# Patient Record
Sex: Male | Born: 1985 | Race: Black or African American | Hispanic: No | Marital: Single
Health system: Midwestern US, Community
[De-identification: ages and names within clinical notes are randomized; demographics above are authoritative.]

## PROBLEM LIST (undated history)

## (undated) DIAGNOSIS — K5090 Crohn's disease, unspecified, without complications: Secondary | ICD-10-CM

## (undated) DIAGNOSIS — D649 Anemia, unspecified: Secondary | ICD-10-CM

---

## 2013-06-09 ENCOUNTER — Emergency Department: Payer: Self-pay | Admitting: Emergency Medicine

## 2013-06-09 LAB — URINALYSIS, COMPLETE
Bacteria: NONE SEEN
Bilirubin,UR: NEGATIVE
Blood: NEGATIVE
GLUCOSE, UR: NEGATIVE mg/dL (ref 0–75)
Leukocyte Esterase: NEGATIVE
NITRITE: NEGATIVE
PROTEIN: NEGATIVE
Ph: 6 (ref 4.5–8.0)
RBC,UR: 2 /HPF (ref 0–5)
Specific Gravity: 1.023 (ref 1.003–1.030)
Squamous Epithelial: <1
WBC UR: 1 /HPF (ref 0–5)

## 2013-06-09 LAB — COMPREHENSIVE METABOLIC PANEL
ALK PHOS: 57 U/L
ALT: 12 U/L (ref 12–78)
ANION GAP: 9 (ref 7–16)
Albumin: 3.7 g/dL (ref 3.4–5.0)
BILIRUBIN TOTAL: 0.3 mg/dL (ref 0.2–1.0)
BUN: 11 mg/dL (ref 7–18)
CHLORIDE: 107 mmol/L (ref 98–107)
Calcium, Total: 8.6 mg/dL (ref 8.5–10.1)
Co2: 28 mmol/L (ref 21–32)
Creatinine: 0.61 mg/dL (ref 0.60–1.30)
EGFR (Non-African Amer.): >60
Glucose: 75 mg/dL (ref 65–99)
OSMOLALITY: 285 (ref 275–301)
Potassium: 3.4 mmol/L — ABNORMAL LOW (ref 3.5–5.1)
SGOT(AST): 10 U/L — ABNORMAL LOW (ref 15–37)
Sodium: 144 mmol/L (ref 136–145)
Total Protein: 7.2 g/dL (ref 6.4–8.2)

## 2013-06-09 LAB — CBC WITH DIFFERENTIAL/PLATELET
Basophil #: 0 10*3/uL (ref 0.0–0.1)
Basophil %: 0.3 %
EOS PCT: 0.2 %
Eosinophil #: 0 10*3/uL (ref 0.0–0.7)
HCT: 25.9 % — ABNORMAL LOW (ref 40.0–52.0)
HGB: 7.8 g/dL — ABNORMAL LOW (ref 13.0–18.0)
LYMPHS PCT: 17.5 %
Lymphocyte #: 1.1 10*3/uL (ref 1.0–3.6)
MCH: 23.6 pg — ABNORMAL LOW (ref 26.0–34.0)
MCHC: 30.2 g/dL — ABNORMAL LOW (ref 32.0–36.0)
MCV: 78 fL — ABNORMAL LOW (ref 80–100)
MONOS PCT: 11.7 %
Monocyte #: 0.7 x10 3/mm (ref 0.2–1.0)
NEUTROS PCT: 70.3 %
Neutrophil #: 4.3 10*3/uL (ref 1.4–6.5)
PLATELETS: 399 10*3/uL (ref 150–440)
RBC: 3.31 10*6/uL — AB (ref 4.40–5.90)
RDW: 18.6 % — AB (ref 11.5–14.5)
WBC: 6.1 10*3/uL (ref 3.8–10.6)

## 2013-06-17 ENCOUNTER — Inpatient Hospital Stay: Payer: Self-pay | Admitting: Internal Medicine

## 2013-06-17 LAB — CBC
HCT: 25.3 % — ABNORMAL LOW (ref 40.0–52.0)
HGB: 7.6 g/dL — AB (ref 13.0–18.0)
MCH: 23.4 pg — ABNORMAL LOW (ref 26.0–34.0)
MCHC: 30.1 g/dL — AB (ref 32.0–36.0)
MCV: 78 fL — AB (ref 80–100)
Platelet: 237 10*3/uL (ref 150–440)
RBC: 3.25 10*6/uL — ABNORMAL LOW (ref 4.40–5.90)
RDW: 18.4 % — ABNORMAL HIGH (ref 11.5–14.5)
WBC: 4.6 10*3/uL (ref 3.8–10.6)

## 2013-06-17 LAB — COMPREHENSIVE METABOLIC PANEL
ALT: 11 U/L — AB (ref 12–78)
Albumin: 3.6 g/dL (ref 3.4–5.0)
Alkaline Phosphatase: 59 U/L
Anion Gap: 4 — ABNORMAL LOW (ref 7–16)
BILIRUBIN TOTAL: 0.4 mg/dL (ref 0.2–1.0)
BUN: 4 mg/dL — AB (ref 7–18)
CO2: 32 mmol/L (ref 21–32)
CREATININE: 0.73 mg/dL (ref 0.60–1.30)
Calcium, Total: 8.8 mg/dL (ref 8.5–10.1)
Chloride: 106 mmol/L (ref 98–107)
EGFR (African American): >60
GLUCOSE: 81 mg/dL (ref 65–99)
OSMOLALITY: 279 (ref 275–301)
Potassium: 3.2 mmol/L — ABNORMAL LOW (ref 3.5–5.1)
SGOT(AST): 8 U/L — ABNORMAL LOW (ref 15–37)
SODIUM: 142 mmol/L (ref 136–145)
Total Protein: 6.8 g/dL (ref 6.4–8.2)

## 2013-06-17 LAB — LIPASE, BLOOD: LIPASE: 261 U/L (ref 73–393)

## 2013-06-17 LAB — MAGNESIUM: Magnesium: 1.9 mg/dL

## 2013-06-17 LAB — URINALYSIS, COMPLETE
BACTERIA: NONE SEEN
Bilirubin,UR: NEGATIVE
Blood: NEGATIVE
GLUCOSE, UR: NEGATIVE mg/dL (ref 0–75)
Ketone: NEGATIVE
LEUKOCYTE ESTERASE: NEGATIVE
Nitrite: NEGATIVE
PROTEIN: NEGATIVE
Ph: 8 (ref 4.5–8.0)
SQUAMOUS EPITHELIAL: NONE SEEN
Specific Gravity: 1.004 (ref 1.003–1.030)
WBC UR: NONE SEEN /HPF (ref 0–5)

## 2013-06-17 LAB — CLOSTRIDIUM DIFFICILE(ARMC)

## 2013-06-18 LAB — CBC WITH DIFFERENTIAL/PLATELET
BASOS ABS: 0 10*3/uL (ref 0.0–0.1)
Basophil %: 0.7 %
Eosinophil #: 0.1 10*3/uL (ref 0.0–0.7)
Eosinophil %: 2.8 %
HCT: 25.8 % — ABNORMAL LOW (ref 40.0–52.0)
HGB: 7.7 g/dL — ABNORMAL LOW (ref 13.0–18.0)
LYMPHS PCT: 27.4 %
Lymphocyte #: 1.3 10*3/uL (ref 1.0–3.6)
MCH: 22.9 pg — ABNORMAL LOW (ref 26.0–34.0)
MCHC: 29.8 g/dL — AB (ref 32.0–36.0)
MCV: 77 fL — AB (ref 80–100)
Monocyte #: 0.5 x10 3/mm (ref 0.2–1.0)
Monocyte %: 10.3 %
Neutrophil #: 2.8 10*3/uL (ref 1.4–6.5)
Neutrophil %: 58.8 %
Platelet: 226 10*3/uL (ref 150–440)
RBC: 3.36 10*6/uL — ABNORMAL LOW (ref 4.40–5.90)
RDW: 18.1 % — ABNORMAL HIGH (ref 11.5–14.5)
WBC: 4.7 10*3/uL (ref 3.8–10.6)

## 2013-06-18 LAB — BASIC METABOLIC PANEL
Anion Gap: 2 — ABNORMAL LOW (ref 7–16)
BUN: 2 mg/dL — AB (ref 7–18)
CHLORIDE: 111 mmol/L — AB (ref 98–107)
CREATININE: 0.76 mg/dL (ref 0.60–1.30)
Calcium, Total: 8.6 mg/dL (ref 8.5–10.1)
Co2: 26 mmol/L (ref 21–32)
EGFR (Non-African Amer.): >60
Glucose: 69 mg/dL (ref 65–99)
OSMOLALITY: 272 (ref 275–301)
POTASSIUM: 3.8 mmol/L (ref 3.5–5.1)
SODIUM: 139 mmol/L (ref 136–145)

## 2013-06-18 LAB — MAGNESIUM: Magnesium: 1.9 mg/dL

## 2013-06-23 ENCOUNTER — Observation Stay: Payer: Self-pay | Admitting: Internal Medicine

## 2013-06-23 LAB — URINALYSIS, COMPLETE
Bilirubin,UR: NEGATIVE
Blood: NEGATIVE
GLUCOSE, UR: NEGATIVE mg/dL (ref 0–75)
Leukocyte Esterase: NEGATIVE
Nitrite: NEGATIVE
Ph: 6 (ref 4.5–8.0)
Protein: NEGATIVE
RBC,UR: 8 /HPF (ref 0–5)
Specific Gravity: 1.021 (ref 1.003–1.030)
Squamous Epithelial: <1
WBC UR: 2 /HPF (ref 0–5)

## 2013-06-23 LAB — CBC
HCT: 26.5 % — ABNORMAL LOW (ref 40.0–52.0)
HGB: 7.9 g/dL — AB (ref 13.0–18.0)
MCH: 23.7 pg — ABNORMAL LOW (ref 26.0–34.0)
MCHC: 30 g/dL — AB (ref 32.0–36.0)
MCV: 79 fL — ABNORMAL LOW (ref 80–100)
Platelet: 244 10*3/uL (ref 150–440)
RBC: 3.35 10*6/uL — ABNORMAL LOW (ref 4.40–5.90)
RDW: 18.3 % — AB (ref 11.5–14.5)
WBC: 6.3 10*3/uL (ref 3.8–10.6)

## 2013-06-23 LAB — BASIC METABOLIC PANEL
ANION GAP: 7 (ref 7–16)
BUN: 10 mg/dL (ref 7–18)
CALCIUM: 9.1 mg/dL (ref 8.5–10.1)
Chloride: 109 mmol/L — ABNORMAL HIGH (ref 98–107)
Co2: 26 mmol/L (ref 21–32)
Creatinine: 0.73 mg/dL (ref 0.60–1.30)
EGFR (Non-African Amer.): >60
GLUCOSE: 80 mg/dL (ref 65–99)
Osmolality: 281 (ref 275–301)
Potassium: 3.4 mmol/L — ABNORMAL LOW (ref 3.5–5.1)
SODIUM: 142 mmol/L (ref 136–145)

## 2013-06-24 ENCOUNTER — Observation Stay: Payer: Self-pay | Admitting: Specialist

## 2013-06-24 LAB — COMPREHENSIVE METABOLIC PANEL
ALK PHOS: 59 U/L
AST: 12 U/L — AB (ref 15–37)
Albumin: 3.5 g/dL (ref 3.4–5.0)
Anion Gap: 7 (ref 7–16)
BILIRUBIN TOTAL: 0.1 mg/dL — AB (ref 0.2–1.0)
BUN: 6 mg/dL — AB (ref 7–18)
CALCIUM: 8.4 mg/dL — AB (ref 8.5–10.1)
Chloride: 111 mmol/L — ABNORMAL HIGH (ref 98–107)
Co2: 26 mmol/L (ref 21–32)
Creatinine: 0.74 mg/dL (ref 0.60–1.30)
EGFR (African American): >60
Glucose: 76 mg/dL (ref 65–99)
OSMOLALITY: 283 (ref 275–301)
Potassium: 3.6 mmol/L (ref 3.5–5.1)
SGPT (ALT): 9 U/L — ABNORMAL LOW (ref 12–78)
Sodium: 144 mmol/L (ref 136–145)
TOTAL PROTEIN: 6.6 g/dL (ref 6.4–8.2)

## 2013-06-24 LAB — CBC
HCT: 24.2 % — ABNORMAL LOW (ref 40.0–52.0)
HGB: 7.2 g/dL — ABNORMAL LOW (ref 13.0–18.0)
MCH: 23.2 pg — ABNORMAL LOW (ref 26.0–34.0)
MCHC: 29.7 g/dL — AB (ref 32.0–36.0)
MCV: 78 fL — AB (ref 80–100)
Platelet: 248 10*3/uL (ref 150–440)
RBC: 3.09 10*6/uL — ABNORMAL LOW (ref 4.40–5.90)
RDW: 18.6 % — AB (ref 11.5–14.5)
WBC: 5.8 10*3/uL (ref 3.8–10.6)

## 2013-06-24 LAB — DRUG SCREEN, URINE
AMPHETAMINES, UR SCREEN: NEGATIVE (ref ?–1000)
BARBITURATES, UR SCREEN: NEGATIVE (ref ?–200)
BENZODIAZEPINE, UR SCRN: NEGATIVE (ref ?–200)
CANNABINOID 50 NG, UR ~~LOC~~: NEGATIVE (ref ?–50)
Cocaine Metabolite,Ur ~~LOC~~: NEGATIVE (ref ?–300)
MDMA (ECSTASY) UR SCREEN: NEGATIVE (ref ?–500)
Methadone, Ur Screen: NEGATIVE (ref ?–300)
OPIATE, UR SCREEN: POSITIVE (ref ?–300)
Phencyclidine (PCP) Ur S: NEGATIVE (ref ?–25)
Tricyclic, Ur Screen: NEGATIVE (ref ?–1000)

## 2013-06-24 LAB — LIPASE, BLOOD: LIPASE: 278 U/L (ref 73–393)

## 2013-06-25 LAB — CBC WITH DIFFERENTIAL/PLATELET
BASOS ABS: 0 10*3/uL (ref 0.0–0.1)
BASOS PCT: 0.6 %
EOS ABS: 0.1 10*3/uL (ref 0.0–0.7)
EOS PCT: 2.3 %
HCT: 23.9 % — ABNORMAL LOW (ref 40.0–52.0)
HGB: 7.1 g/dL — ABNORMAL LOW (ref 13.0–18.0)
LYMPHS PCT: 34.4 %
Lymphocyte #: 1.4 10*3/uL (ref 1.0–3.6)
MCH: 23.1 pg — ABNORMAL LOW (ref 26.0–34.0)
MCHC: 29.8 g/dL — AB (ref 32.0–36.0)
MCV: 78 fL — ABNORMAL LOW (ref 80–100)
Monocyte #: 0.5 x10 3/mm (ref 0.2–1.0)
Monocyte %: 12.5 %
NEUTROS ABS: 2 10*3/uL (ref 1.4–6.5)
Neutrophil %: 50.2 %
Platelet: 228 10*3/uL (ref 150–440)
RBC: 3.07 10*6/uL — AB (ref 4.40–5.90)
RDW: 18.8 % — ABNORMAL HIGH (ref 11.5–14.5)
WBC: 3.9 10*3/uL (ref 3.8–10.6)

## 2014-04-19 ENCOUNTER — Emergency Department
Admission: EM | Admit: 2014-04-19 | Discharge: 2014-04-19 | Disposition: A | Discharge status: Home / Self Care | Payer: Medicaid Other | Attending: Emergency Medicine | Admitting: Emergency Medicine

## 2014-04-19 ENCOUNTER — Emergency Department: Payer: Self-pay

## 2014-04-19 ENCOUNTER — Emergency Department: Payer: Medicaid Other

## 2014-04-19 DIAGNOSIS — G8929 Other chronic pain: Secondary | ICD-10-CM | POA: Insufficient documentation

## 2014-04-19 DIAGNOSIS — R109 Unspecified abdominal pain: Secondary | ICD-10-CM | POA: Insufficient documentation

## 2014-04-19 HISTORY — DX: Crohn's disease, unspecified, without complications: K50.90

## 2014-04-19 HISTORY — DX: Anemia, unspecified: D64.9

## 2014-04-19 LAB — COMPREHENSIVE METABOLIC PANEL
ALT: 10 U/L (ref 0–55)
AST (SGOT): 15 U/L (ref 5–34)
Albumin/Globulin Ratio: 1.4 (ref 0.9–2.2)
Albumin: 4.2 g/dL (ref 3.5–5.0)
Alkaline Phosphatase: 78 U/L (ref 38–106)
BUN: 8 mg/dL — ABNORMAL LOW (ref 9.0–28.0)
Bilirubin, Total: 0.5 mg/dL (ref 0.2–1.2)
CO2: 29 mEq/L (ref 22–29)
Calcium: 9.5 mg/dL (ref 8.5–10.5)
Chloride: 103 mEq/L (ref 100–111)
Creatinine: 0.8 mg/dL (ref 0.7–1.3)
Globulin: 2.9 g/dL (ref 2.0–3.6)
Glucose: 83 mg/dL (ref 70–100)
Potassium: 4.1 mEq/L (ref 3.5–5.1)
Protein, Total: 7.1 g/dL (ref 6.0–8.3)
Sodium: 140 mEq/L (ref 136–145)

## 2014-04-19 LAB — URINALYSIS, REFLEX TO MICROSCOPIC EXAM IF INDICATED
Bilirubin, UA: NEGATIVE
Blood, UA: NEGATIVE
Glucose, UA: NEGATIVE
Ketones UA: NEGATIVE
Leukocyte Esterase, UA: NEGATIVE
Nitrite, UA: NEGATIVE
Protein, UR: NEGATIVE
Specific Gravity UA: 1.01 (ref 1.001–1.035)
Urine pH: 7 (ref 5.0–8.0)
Urobilinogen, UA: NORMAL mg/dL

## 2014-04-19 LAB — CBC AND DIFFERENTIAL
Basophils Absolute Automated: 0.01 10*3/uL (ref 0.00–0.20)
Basophils Automated: 0 %
Eosinophils Absolute Automated: 0.06 10*3/uL (ref 0.00–0.70)
Eosinophils Automated: 1 %
Hematocrit: 34.8 % — ABNORMAL LOW (ref 42.0–52.0)
Hgb: 10.8 g/dL — ABNORMAL LOW (ref 13.0–17.0)
Immature Granulocytes Absolute: 0 10*3/uL
Immature Granulocytes: 0 %
Lymphocytes Absolute Automated: 1.37 10*3/uL (ref 0.50–4.40)
Lymphocytes Automated: 24 %
MCH: 27.6 pg — ABNORMAL LOW (ref 28.0–32.0)
MCHC: 31 g/dL — ABNORMAL LOW (ref 32.0–36.0)
MCV: 88.8 fL (ref 80.0–100.0)
MPV: 10.4 fL (ref 9.4–12.3)
Monocytes Absolute Automated: 0.43 10*3/uL (ref 0.00–1.20)
Monocytes: 8 %
Neutrophils Absolute: 3.77 10*3/uL (ref 1.80–8.10)
Neutrophils: 67 %
Nucleated RBC: 0 /100 WBC (ref 0–1)
Platelets: 286 10*3/uL (ref 140–400)
RBC: 3.92 10*6/uL — ABNORMAL LOW (ref 4.70–6.00)
RDW: 15 % (ref 12–15)
WBC: 5.64 10*3/uL (ref 3.50–10.80)

## 2014-04-19 LAB — GFR: EGFR: >60

## 2014-04-19 LAB — SEDIMENTATION RATE: Sed Rate: 20 mm/Hr — ABNORMAL HIGH (ref 0–15)

## 2014-04-19 LAB — LIPASE: Lipase: 28 U/L (ref 8–78)

## 2014-04-19 LAB — C-REACTIVE PROTEIN: C-Reactive Protein: 0.2 mg/dL (ref 0.0–0.8)

## 2014-04-19 MED ORDER — HYDROMORPHONE HCL 1 MG/ML IJ SOLN
1.0000 mg | Freq: Once | INTRAMUSCULAR | Status: DC
Start: 2014-04-19 — End: 2014-04-19

## 2014-04-19 MED ORDER — HYDROMORPHONE HCL 1 MG/ML IJ SOLN
1.0000 mg | Freq: Once | INTRAMUSCULAR | Status: AC
Start: 2014-04-19 — End: 2014-04-19
  Administered 2014-04-19: 1 mg via INTRAVENOUS

## 2014-04-19 MED ORDER — ONDANSETRON HCL 4 MG/2ML IJ SOLN
4.0000 mg | Freq: Once | INTRAMUSCULAR | Status: AC
Start: 2014-04-19 — End: 2014-04-19
  Administered 2014-04-19: 4 mg via INTRAVENOUS

## 2014-04-19 MED ORDER — ONDANSETRON HCL 4 MG/2ML IJ SOLN
4.0000 mg | Freq: Once | INTRAMUSCULAR | Status: DC
Start: 2014-04-19 — End: 2014-04-19

## 2014-04-19 MED ORDER — ONDANSETRON HCL 4 MG/2ML IJ SOLN
4.0000 mg | Freq: Once | INTRAMUSCULAR | Status: DC
Start: 2014-04-19 — End: 2014-04-19
  Filled 2014-04-19: qty 2

## 2014-04-19 MED ORDER — ONDANSETRON 4 MG PO TBDP
4.0000 mg | ORAL_TABLET | Freq: Once | ORAL | Status: DC
Start: 2014-04-19 — End: 2014-04-19
  Filled 2014-04-19: qty 1

## 2014-04-19 MED ORDER — HYDROMORPHONE HCL 1 MG/ML IJ SOLN
1.0000 mg | Freq: Once | INTRAMUSCULAR | Status: DC
Start: 2014-04-19 — End: 2014-04-19
  Filled 2014-04-19: qty 1

## 2014-04-19 MED ORDER — DIPHENHYDRAMINE HCL 25 MG PO CAPS
50.0000 mg | ORAL_CAPSULE | Freq: Once | ORAL | Status: DC
Start: 2014-04-19 — End: 2014-04-19
  Filled 2014-04-19: qty 2

## 2014-04-19 MED ORDER — SODIUM CHLORIDE 0.9 % IV BOLUS
1000.0000 mL | Freq: Once | INTRAVENOUS | Status: AC
Start: 2014-04-19 — End: 2014-04-19
  Administered 2014-04-19: 1000 mL via INTRAVENOUS

## 2014-04-19 MED ORDER — ONDANSETRON HCL 4 MG/2ML IJ SOLN
INTRAMUSCULAR | Status: DC
Start: 2014-04-19 — End: 2014-04-19
  Filled 2014-04-19: qty 2

## 2014-04-19 MED ORDER — HYDROMORPHONE HCL 1 MG/ML IJ SOLN
2.0000 mg | Freq: Once | INTRAMUSCULAR | Status: DC
Start: 2014-04-19 — End: 2014-04-19
  Filled 2014-04-19: qty 2

## 2014-04-19 NOTE — ED Notes (Signed)
Pt agitated at offer of PO benadryl. MD notified.

## 2014-04-19 NOTE — ED Notes (Signed)
Pt told by providers no additional narcotic pain medication would be offered after questioned about a recent Rx filled for oxycodone. Pt became agitated and aggressive. Pt stated "I hope I see you dead on the side of the road" to pharmacist sitting at desk outside his room. Pt then seen walking out of room dressed in direction of exit. This RN requested to see pt's arm to remove IV. Pt stated "I ain't got no fuckin' IV." This RN and resident MD informed pt we had to check for documentation and safety. Pt became aggressive towards staff. Security called to scene. IV removal confirmed. Pt ambulated out of ED with steady unassisted gait yelling, threatening, and cursing at staff. Charge nurse aware.

## 2014-04-19 NOTE — ED Notes (Signed)
This patient was seen by me in triage and initial testing was ordered based on presenting complaint. Care was expedited. I am not the primary provider for this patient.   29 y.o. male here with diffuse ab pain n/v  History of chron's     Joice Lofts, MD  04/19/14 [phone]

## 2014-04-19 NOTE — ED Provider Notes (Signed)
Attending Note    Seen with resident - Chad Franco is a 29 y.o. 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 Assoc with nausea and difficulty urinating. Pt reports developign abd pain with subjective fever today. Has appointment with GI on 05/07/2014.  My exam - alert, non-toxic,  NAD  Lungs - clear  Cor - RRR  Abd -soft - mild diffuse tenderness - no guarding, rebound.  Well healed surgical incision.    Note - very difficult IV access -  Labs without significant findings -  Resident accessed CRISP data base -  Patient with multiple visits to [place] EDs in last two months - has had at least 2 CT scans and MRI  Without acute findings.  Has been given multiple prescriptions for narcotic medications - last 3/29.  When asked about recent visits and prescriptions, became argumentative -  Had not reported any of this  Until asked.  I told patient that we could not provide more definitive treatment for Crohn's disease such  As biologic agents, and would need to see GI as scheduled.  I am concerned for chronic pain and drug seeking behavior.  Patient cursed at staff and walked out of ED prior to completion of treatment.  Attestations:  I was acting as a Neurosurgeon for Chad Minister, MD on [employer] Franco  Treatment Team: Scribe: Chad Franco     I am the first provider for this patient and I personally performed the services documented. Treatment Team: Scribe: Chad Franco is scribing for me on Chad Franco. This note accurately reflects work and decisions made by me.  Chad Minister, MD    Chad Minister, MD  04/19/14 Chad Franco

## 2014-04-19 NOTE — ED Notes (Signed)
Bed: S 5  Expected date:   Expected time:   Means of arrival:   Comments:  Terminal

## 2014-04-19 NOTE — ED Notes (Signed)
[name] EMERGENCY DEPARTMENT RESIDENT H&P       CLINICAL INFORMATION        HPI:      Chief Complaint: Abdominal Pain  .    Chad Franco is a 29 y.o. male who presents with Abdominal pain, nausea and vomiting. Pt has PMH of Crohn's disease on Prednisone, presented to ED with 2-3 week of nausea, vomiting, watery diarrhea a/w occasional bright red blood spots and mucous. Pt started to have abdominal pain started today, pain is periumbilical. Crampy, non radiating, denies fever, chills, melena or maroon blood with stool. Pt endorse some smelly discharge from his abdominal scar. Pt states that he also has some difficulty urinating.Denies chest pain, back pain and flank pain. 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 State that his care started at [facility] in [place] and now he is following up with a doctor in [place], ad he presented to [facility] because he is attending a training course in [place].      History obtained from: patient     ==============================================================  Update at 0620 pm. CRISP: "Did an extensive CRISP review. Pt seen at multiple hospitals including [facility],  [facility], [facility], and [facility] . He  has had 5 X-rays, three CT'S and 1 MRI's thus far in 2016. All the results are  essentially normal or read as severe constipation. Most recent MRI from three  days ago showed no inflammatory disease.  Pt discharged from [facility] 3 days  ago. MRI revealed no obst or signs of active inflammatory disease.  MRI  thus far in 2016 alone. [facility], he was found to be  constipated, no obstruction. Feb 20th, had a normal CT Abdomen at [facility].  03/13/14- MRI Enterography - at [facility], found to be constipated, but  otherwise an unremarkable MRI  enterography of abdomen and pelvis. No bowel wall  thickening, no IBS, or blockage. Feb 29th- CT Abdomen in [facility] which was normal March 7th at [facility], CT Abd showed pt to  be constipated. March 14th- back at [facility], X-ray, large amount of fecal  material, constipation, otherwise negative. Had a normal XR Abdomen on 04/05/14.  March 15th, discharged from [facility] where he was admitted with just abdominal  pain. He was on PCA pump and recommended follow up with Dr. Joannie Springs and pain"    In march only the patient had the following ER visits:   March 28 [facility]  [facility])   March 23 [facility]   March 22 [facility]   March 20 [facility]   March 18 [facility]  March 17 [facility]   March 14  [facility]   March 06 [facility]       Off note the pt was not forthcoming with his multiple ED visits.     ROS:      Review of Systems   Constitutional: Negative for fever, chills and fatigue.   All other systems reviewed and are negative.        Physical Exam:      Pulse 70  BP 119/74 mmHg  Resp 14  SpO2 100 %  Temp 98.5 F (36.9 C)    Physical Exam   Constitutional: He is oriented  to person, place, and time. He appears well-developed and well-nourished. No distress.   HENT:   Head: Normocephalic and atraumatic.   Eyes: EOM are normal. Pupils are equal, round, and reactive to light.   Neck: Normal range of motion. Neck supple.   Cardiovascular: Normal rate, regular rhythm, normal heart sounds and intact distal pulses.    Pulmonary/Chest: Effort normal and breath sounds normal. No respiratory distress. He has no rales.   Abdominal: Soft. Bowel sounds are normal. There is tenderness.   Tenderness in periumbilical area. Midline scar, no collection, no discharge. No signs of cellulitis.      Musculoskeletal: Normal range of motion. He exhibits no tenderness.   Neurological: He is alert and oriented to person, place, and  time. No cranial nerve deficit. Coordination normal.   Skin: Skin is warm and dry. He is not diaphoretic.               PAST HISTORY        Primary Care Provider: Patsy Lager, MD        PMH/PSH:    .     Past Medical History   Diagnosis Date   . Crohn's disease    . Anemia        He has no past surgical history on file.      Social/Family History:      He reports that he has never smoked. He does not have any smokeless tobacco history on file. He reports that he does not drink alcohol. His drug history is not on file.    No family history on file.      Listed Medications on Arrival:    .     Home Medications     Last Medication Reconciliation Action:  Complete Pasion, Morrie Sheldon, RN 04/19/2014  4:10 PM          No Medications         Allergies: He is allergic to amoxicillin; iodinated diagnostic agents; morphine; other; and toradol.            VISIT INFORMATION        Reassessments/Clinical Course:            Conversations with Other Providers:              Medications Given in the ED:    .     ED Medication Orders     Start Ordered     Status Ordering Provider    04/19/14 1800 04/19/14 1759  diphenhydrAMINE (BENADRYL) capsule 50 mg   Once     Route: Oral  Ordered Dose: 50 mg     Last MAR action:  Not Given MECHANICK, JUDITH    04/19/14 1732 04/19/14 1731  HYDROmorphone (DILAUDID) injection 1 mg   Once     Route: Intravenous  Ordered Dose: 1 mg     Last MAR action:  Given MECHANICK, JUDITH    04/19/14 1732 04/19/14 1731  ondansetron (ZOFRAN) injection 4 mg   Once     Route: Intravenous  Ordered Dose: 4 mg     Last MAR action:  Given MECHANICK, JUDITH    04/19/14 1731 04/19/14 1730     Once,   Status:  Discontinued     Route: Intravenous  Ordered Dose: 1 mg     Discontinued Jacci Ruberg S    04/19/14 1731 04/19/14 1730     Once,   Status:  Discontinued     Route: Intravenous  Ordered  Dose: 4 mg     Discontinued Yeng Frankie S    04/19/14 1637 04/19/14 1636     Once,   Status:  Discontinued     Route:  Oral  Ordered Dose: 4 mg     Discontinued MECHANICK, JUDITH    04/19/14 1636 04/19/14 1636     Once,   Status:  Discontinued     Route: Intramuscular  Ordered Dose: 2 mg     Discontinued MECHANICK, JUDITH    04/19/14 1459 04/19/14 1458  sodium chloride 0.9 % bolus 1,000 mL   Once     Route: Intravenous  Ordered Dose: 1,000 mL     Last MAR action:  New Bag Elesa Hacker R    04/19/14 1459 04/19/14 1458     Once,   Status:  Discontinued     Route: Intravenous  Ordered Dose: 1 mg     Discontinued Joice Lofts    04/19/14 1459 04/19/14 1458     Once,   Status:  Discontinued     Route: Intravenous  Ordered Dose: 4 mg     Discontinued GIRMA, BELETSHACHEW R            Procedures:      Procedures      Assessment/Plan:    Will treat pt pain and nausea  Will send  CBC/CMP/Lipase/ ESR/ CRP  Will do a Crisp review     ----     0630 pm When patient was confronted with the results of the CRISP Review, and was questioned on why he visited multiple different EDs and why he was not forthcoming with this info, he got agitated and stated that "this is not of your business to know this" and then ripped his IV out and eloped from the ED and refused to stay for the results.              Avis Epley, MD  Resident  04/19/14 Mikle Bosworth

## 2014-05-12 NOTE — Consult Note (Signed)
Brief Consult Note: Diagnosis: C. difficile enterocolitis, history of Crohns disease.   Patient was seen by consultant.   Consult note dictated.   Recommend further assessment or treatment.   Discussed with Attending MD.   Comments: Please see full GI consult [phone]#[id number].  Patient admited with abdominal pain and acute onset diarrheal illness.  Stool positive for toxigenic c diff, but negative for toxin a/b.  Patietn has had c diff on multiple occasions in the past and related this as being more like those previous episodes than  a exacerbation of Crohns.   Feeling some better after starting abx.  Currently on both flagyl and vancomycin po.  Will continue these for now, but will d/c the flagyl in a few days.  Flagyl can also be considered as an adjunct tx for crohns.   Will need to see further response to tx/  He has been off Humira, his medicine for crohns for at least 6 weeks due to insurance change.  This will need to be restarted once the c diff is treated adequately.  following.   I have requested nursing to get records from his previous crohns MDs and hospitalizations.  Electronic Signatures: Barnetta ChapelSkulskie, Pebble Botkin (MD)  (Signed 31-May-15 17:13)  Authored: Brief Consult Note   Last Updated: 31-May-15 17:13 by Barnetta ChapelSkulskie, Genessa Beman (MD)

## 2014-05-12 NOTE — H&P (Signed)
PATIENT NAME:  Clayton Ray, Clayton Ray DATE OF BIRTH:  1986/01/09  DATE OF ADMISSION:  06/24/2013  REFERRING PHYSICIAN: Loraine LericheMark R. Fanny BienQuale, MD  PRIMARY CARE PHYSICIAN: Nonlocal.   GI: [facility].   CHIEF COMPLAINT: Abdominal pain, diarrhea.  This is a 29 year old African American gentleman with past medical history of Crohn's diagnosed about 8 years ago, as well as recurrent C. difficile, presenting with abdominal pain, diarrhea, recently discharged from [facility] with discharge diagnoses of C. difficile and Crohn's flare. Since the discharge his diarrhea has actually increased to having multiple bouts of watery diarrhea with associated nausea and vomiting which is nonbloody or bilious. He also denotes having some bright red blood per rectum, after having multiple bouts of diarrhea. He has abdominal pain located in the right lower quadrant, crampy in quality, 6 to 7 out of 10 in intensity. No worsening or relieving factors, nonradiating. He actually presented to the hospital one day prior to this for the above symptoms. Please refer to my H and P performed at that time and then he apparently left AGAINST MEDICAL ADVICE after discussing his medical care with Dr. Amado CoeGouru, though his symptoms persisted, so he decided to present back to the hospital for further work-up and evaluation.    REVIEW OF SYSTEMS:  CONSTITUTIONAL: Denies fevers. Positive for fatigue, weakness.  EYES: Denies blurred vision, double vision, eye pain.  EARS, NOSE, THROAT: Denies significant hearing loss.  RESPIRATORY: Denies cough, wheeze, shortness of breath.  CARDIOVASCULAR: No chest pain, palpitations, edema.  GASTROINTESTINAL: Positive for nausea, vomiting, diarrhea, abdominal pain as well as bright red blood per rectum.  GENITOURINARY: Denies dysuria, hematuria.  ENDOCRINE: Denies nocturia or thyroid problems.  HEMATOLOGY: Denies easy bruising or bleeding.  SKIN: Denies rashes or lesions.   MUSCULOSKELETAL: Denies pain in neck, back, shoulder, knees, hips or arthritic symptoms.  NEUROLOGIC: Denies paralysis, paresthesias.  PSYCHIATRIC: Denies anxiety or depressive symptoms.  Otherwise a full review of systems performed by me is negative.   PAST MEDICAL HISTORY: Crohn's diagnosed eight years ago, as well as recurrent C. difficile.   SOCIAL HISTORY: Denies any alcohol, tobacco or drug usage.   FAMILY HISTORY: Positive for irritable bowel syndrome, however, no family history of Crohn's.   ALLERGIES: AMOXICILLIN IV, CONTRAST DYE, KETOROLAC, AS WELL AS MORPHINE.   HOME MEDICATIONS: Acetaminophen/oxycodone 325/5 mg 1 tablet p.o. 1 to 3 times daily as needed for pain, Zofran 4 mg up to 3 times daily as needed for nausea, vancomycin 250 mg p.o. q.6 hours, pantoprazole 40 mg p.o. daily.   PHYSICAL EXAMINATION: VITAL SIGNS: Temperature 99.3, heart rate 83, respirations 20, blood pressure 127/67, saturating 100% on room air. Weight 68.5 kg, BMI 20.5.  GENERAL: Well-nourished, well-developed, African American gentleman in minimal distress secondary to abdominal pain.  HEAD: Normocephalic, atraumatic.  EYES: Pupils equal, round, and reactive to light. Extraocular muscles are intact. No scleral icterus.  MOUTH: Moist mucous membranes. Dentition intact. No abscess noted.  EARS/NOSE/THROAT: Clear without exudates. No external lesions.  NECK: Supple. No thyromegaly. No nodules. No JVD.  PULMONARY: Clear to auscultation bilaterally without wheezes, rales, or rhonchi. No use of accessory muscles. Good respiratory effort.  CHEST: Nontender to palpation.  CARDIOVASCULAR: S1, S2, regular rate and rhythm. No murmurs, rubs, or gallops. No edema. Pedal pulses 2+ bilaterally.  GASTROINTESTINAL: Soft. Minimal tenderness right upper quadrant without rebound or guarding, no motion tenderness, nondistended. Positive bowel sounds. No hepatosplenomegaly.   MUSCULOSKELETAL: No swelling, clubbing, or  edema. Range of  motion full in all extremities.  NEUROLOGIC: Cranial nerves II through XII intact. No gross focal neurological deficits. Sensation intact. Reflexes intact.  SKIN: No ulceration lesions, rashes. Turgor intact.  PSYCHIATRIC: Mood and affect within normal limits. Patient alert and oriented x3. Insight and judgment intact.   LABORATORY DATA: Sodium 144, potassium 3.6, chloride 111, bicarbonate 26, BUN 6, creatinine 0.74, glucose 76. LFTs: Bilirubin of 0.1, AST of 12, ALT of 9; otherwise, within normal limits. WBC 5.8, hemoglobin 7.2, platelets of 248,000. Urinalysis negative for evidence of infection.   ASSESSMENT AND PLAN: A 29 year old [place] American gentleman with history of C. difficile and Crohn's who was actually admitted yesterday, left AGAINST MEDICAL ADVICE for family reasons, returning with similar complaints of abdominal pain, nausea, vomiting, diarrhea. 1. Abdominal pain. PRN medications for supportive care. This likely related to Crohn's versus Clostridium difficile. Also consult GI for continuation of symptoms.  2. Continue p.o. vancomycin. He is on 12-day course. Keep on isolation precautions.  3. Crohn's. He will need long-term treatment, whether this be Humira versus other agents. Would question the utility of using steroids to help decrease acute exacerbation. We will defer to GI.  4. Blood per, rectum. Will trend CBC, transfuse him with threshold hemoglobin of less than 7.  5. Venous thromboembolism prophylaxis. With sequential compression devices.   CODE STATUS: The patient is full code.   TIME SPENT: 45 minutes    ____________________________ Cletis Athens. Griffin Dewilde, MD dkh:lm D: 06/24/2013 21:10:24 ET T: 06/24/2013 22:52:15 ET JOB#: [id number]  cc: Cletis Athens. Bryona Foxworthy, MD, <Dictator> Chaelyn Bunyan Synetta Shadow MD ELECTRONICALLY SIGNED 06/25/2013 1:06

## 2014-05-12 NOTE — Consult Note (Signed)
PATIENT NAME:  Clayton Ray, Clayton Ray MR#:  [id number] DATE OF BIRTH:  1985/04/18  DATE OF CONSULTATION:  06/25/2013  CONSULTING SERVICE: Gastroenterology    CONSULTING PHYSICIAN:  Midge Miniumarren Nesta Scaturro, MD  REASON FOR CONSULTATION: C. difficile colitis with Crohn's disease.   HISTORY OF PRESENT ILLNESS: This patient is a 29 year old gentleman who was recently in the hospital, but left AMA for personal reasons. The patient was in the hospital with Crohn's disease and a history of C. diff that was documented positive approximately 7 days ago. The patient states that he had surgeries in the past to remove his inflamed bowel, [facility], and [place]. The patient states that he was well up until a month and a half ago, when he was taking Humira up until his prescription ran out. Then he stopped taking the medication, waiting for further assistance from the company. Since then, the patient has had multiple flares of Crohn's disease, with severe abdominal pain and diarrhea, with associated nausea and vomiting that he reports to be nonbloody. The patient does report that he has had rectal bleeding with his diarrhea, and the abdominal pain is mostly in the right lower quadrant. The patient does report that the pain is crampy in nature. He denies any unexplained weight loss, although he does not have a good appetite.   PAST MEDICAL HISTORY: Crohn's disease, recurrent C. diff colitis approximately 5 times.   REVIEW OF SYSTEMS:  A 10-point review of systems negative, except what was stated above.   SOCIAL HISTORY: Denies alcohol, tobacco, or drug use.   FAMILY HISTORY: Noncontributory.    ALLERGIES: AMOXICILLIN, CONTRAST DYE, MORPHINE, and KETOROLAC.   HOME MEDICATIONS: Acetaminophen/oxycodone, Zofran, vancomycin, pantoprazole.   PHYSICAL EXAMINATION: GENERAL: The patient in bed in obvious discomfort.  VITAL SIGNS: Temperature 98.1, pulse 76, respirations 18, blood  pressure 133/73, pulse oximetry 99%.  HEENT: Normocephalic, atraumatic. Extraocular motor intact. Pupils equally round, reactive to light and accommodation, without JVD, without lymphadenopathy.  LUNGS: Clear to auscultation bilaterally.  HEART: Regular rate and rhythm without murmurs, rubs, or gallops.  ABDOMEN: Positive midline scar, with diffuse tenderness, with slight rebound and guarding. Hepatosplenomegaly could not be assessed.  EXTREMITIES: Without cyanosis, clubbing, or edema.  NEUROLOGIC: Exam grossly intact.  SKIN: Without any rashes or lesions.   ANCILLARY SERVICES: Show the patient's white cell count of 3.9, hemoglobin of 7.1, hematocrit of 23.9. The patient's CT scan showed no evidence of active Crohn's disease. No fistula or abscess. Enteric colonic anastomosis in the right lower quadrant, without complications, and single lymph node in the ileocecal mesentery, which is nonspecific. This CT scan was done on June 2 of this year.   ASSESSMENT AND PLAN: This patient is a 29 year old gentleman with a CT scan not showing any active disease, but he does have a positive C. difficile colitis and has not been doing better on vancomycin. The patient will be started on IV Flagyl, in addition to the vancomycin, and he will see how his symptoms progress with that. If the patient's symptoms continue to be present after the C. diff is treated, then the patient may need to be started on prednisone for his Crohn's disease. The patient also will have his C. diff checked again today.   Thank you very much for involving me in the care of this patient. If you have any questions, please do not hesitate to call.   ____________________________ Midge Miniumarren Shylyn Younce, MD dw:mr D: 06/25/2013 12:15:39 ET T: 06/25/2013  17:51:27 ET JOB#: [id number]  cc: Midge Minium, MD, <Dictator> Midge Minium MD ELECTRONICALLY SIGNED 06/26/2013 7:17

## 2014-05-12 NOTE — Consult Note (Signed)
Chief Complaint:  Subjective/Chief Complaint starting ot feel a little better, pain 8/10, no emesis continued nausea and abdominal pain.   VITAL SIGNS/ANCILLARY NOTES: **Vital Signs.:   02-Jun-15 13:28  Vital Signs Type Routine  Temperature Temperature (F) 99.1  Celsius 37.2  Temperature Source oral  Pulse Pulse 87  Respirations Respirations 18  Systolic BP Systolic BP 121  Diastolic BP (mmHg) Diastolic BP (mmHg) 74  Mean BP 89  Pulse Ox % Pulse Ox % 96  Pulse Ox Activity Level  At rest  Oxygen Delivery Room Air/ 21 %  *Intake and Output.:   Shift 02-Jun-15 23:00  Length of Stay Totals Intake:  5693 Output:  6900    Net:  -1207   Brief Assessment:  Cardiac Regular   Respiratory clear BS   Gastrointestinal details normal Nondistended  Bowel sounds normal  No rebound tenderness  mild firmness/voluntary guarding, diffuse tenderness mostly right abdomen.   Radiology Results: CT:    02-Jun-15 13:58, CT Abdomen and Pelvis Without Contrast  CT Abdomen and Pelvis Without Contrast   REASON FOR EXAM:    (1) Abdominal pain, nausea, vomiting; (2) abdominal   pain, nausea, vomiting  COMMENTS:       PROCEDURE: CT  - CT ABDOMEN AND PELVIS W0  - Jun 20 2013  1:58PM     CLINICAL DATA:  Abdominal pain, Crohn's disease. Reported history of  IV contrast allergy    EXAM:  CT ABDOMEN AND PELVIS WITHOUT CONTRAST    TECHNIQUE:  Multidetector CT imaging of the abdomen and pelvis was performed  following the standard protocol without IV contrast.  COMPARISON:  CT 06/09/2013    FINDINGS:  Lung bases are clear.  No pericardial fluid.    No focal hepatic lesions non contrast exam. The pancreas,  gallbladder, spleen, adrenal glands, kidneys are normal.    Stomach duodenum appear normal. The jejunum and ileum appear normal.  There is no mucosal irregularity evident. No evidence of edema or  bowel wall thickening. There is a surgical anastomosis in the right  lower quadrant with  partial right colectomy. No evidence nodularity,  stricture, or obstruction at the anastomosis. There is a rounded  lymph node in the ileocecal mesenteric measuring 12 mm (image 45,  series 2). Neck evidence of inflammation or obstruction of the  colon. Moderate volume of stool in the rectum.    Abdominal or is normal caliber. No retroperitoneal periportal  lymphadenopathy.    No free fluid the pelvis. No pelvic lymphadenopathy. Prostate gland  and bladder normal.     IMPRESSION:  1. No evidence of active Crohn's disease.  No fistula or abscess.  2. Enteric colonic anastomosis in the right lower quadrant without  complication.  3. Single lymph node in the ileocecal mesentery is nonspecific.  Marland Kitchen.    Electronically Signed    By: Genevive BiStewart  Edmunds M.D.    On: 06/20/2013 14:04         Verified By: Patriciaann ClanJOHN S. EDMUNDS, M.D.,   Assessment/Plan:  Assessment/Plan:  Assessment 1) c diff-slow improvement on po vancomycin.  2) h/o crohns disease-CT today negative for overt active involvement   Plan 1) will need to continue vanc po at current dose for 10 days, then taper one dose daily every 3 days until off.  will need to have GI fu in about a week, I want to be able to restart humira at that time.  We ar trying to get patient assistance.   Electronic Signatures: Marva PandaSkulskie,  Daphine Deutscher (MD)  (Signed 02-Jun-15 18:53)  Authored: Chief Complaint, VITAL SIGNS/ANCILLARY NOTES, Brief Assessment, Radiology Results, Assessment/Plan   Last Updated: 02-Jun-15 18:53 by Barnetta Chapel (MD)

## 2014-05-12 NOTE — H&P (Signed)
PATIENT NAME:  Clayton Ray, SHELL MR#:  [id number] DATE OF BIRTH:  09/13/1985  DATE OF ADMISSION:  06/17/2013  PRIMARY CARE PHYSICIAN:  Non-local  REFERRING PHYSICIAN:  Dr. Lenard Lance   CHIEF COMPLAINT: Abdominal pain, nausea, vomiting, diarrhea for 2 days.   HISTORY OF PRESENT ILLNESS: A 29 year old African-American male with a history of Crohn's disease, fistula of bladder, recurrent C. diff colitis, presented to the ED with abdominal pain, nausea and vomiting for 2 days. The patient is alert, awake, oriented, in no acute distress. The patient started to have abdominal pain 2 days ago, which is constant, sharp, 8 out of 10, initially intermittent, then became constant, associated with nausea, vomiting multiple times, and also diarrhea for the past 2 days. In addition, patient noticed bloody stool today. The patient also complains of fever, chills, and weakness, unable to eat. The patient lost weight, 5 pounds in the past one month. The patient's stool C. difficile test is positive.   PAST MEDICAL HISTORY: Crohn's disease, fistula of intestine and bladder, recurrent C. diff colitis.   SURGICAL HISTORY: Abdominal surgery due to Crohn's disease, with intestine removed.   SOCIAL HISTORY: No smoking or drinking or invasive drugs.   FAMILY HISTORY: Father has IBS.  ALLERGIES: AMOXICILLIN IVP DYE, KETOROLAC, MORPHINE.   HOME MEDICATIONS: Zofran 4 mg p.o. every 8 hours p.r.n., vitamin B12, 1000 mcg p.o. daily, multivitamin 1 tablet once a day, Percocet 325/5 mg p.o. tablets 1 tablet every 6 hours p.r.n. pain.  REVIEW OF SYSTEMS:   CONSTITUTIONAL: The patient has fever and chills. No headache or dizziness, but has generalized weakness and poor oral intake.  EYES: No double vision or blurred vision.  EAR, NOSE, THROAT: No postnasal drip, slurred speech, or dysphagia.  CARDIOVASCULAR: No chest pain, palpitation, orthopnea, nocturnal dyspnea. No leg edema.  PULMONARY: No cough, sputum, shortness of  breath, or hemoptysis.  GASTROINTESTINAL: Positive for abdominal pain, nausea, vomiting, diarrhea, bloody stool.  GENITOURINARY: No dysuria, hematuria, or incontinence.  SKIN: No rash or jaundice.  NEUROLOGIC: No syncope, loss of consciousness, or seizure.  HEMATOLOGIC: No easy bruising or bleeding.  ENDOCRINE: No polyuria, polydipsia, heat or cold intolerance.  SKIN: No rash or jaundice.   VITAL SIGNS: Temperature 99.1, blood pressure 129/71, pulse 92, O2 saturation 100% on room air.   PHYSICAL EXAMINATION: GENERAL: The patient is alert, awake, oriented, in no acute distress.  HEENT: Pupils round, equal, react to light and accommodation. Moist oral mucosa. Clear oropharynx. NECK: Supple. No JVD or carotid bruit. No lymphadenopathy. No thyromegaly.  CARDIOVASCULAR: S1, S2. Regular rate and rhythm. No murmurs or gallops. PULMONARY: Bilateral air entry. No wheezing or rales. No use of accessory muscle to breathe.  ABDOMEN: Soft. No distention, but has tenderness in the middle part of the abdomen. There is a surgical scar in the middle of the abdomen. No obvious organomegaly. No rigidity. No rebound. Bowel sounds present.  EXTREMITIES: No edema, clubbing, or cyanosis. No calf tenderness. Bilateral pedal pulses present.  SKIN: No rash or jaundice.  NEUROLOGIC: Alert and oriented x 3. No focal deficit. Power 5/5. Sensation intact.   LABORATORY DATA: WBC 4.6, hemoglobin 7.6, platelets 237, lipase 261. Glucose 81, BUN 4, creatinine 0.73, sodium 142, potassium 3.2, chloride 106, bicarb 32. Urinalysis is negative. Stool C. diff is positive.   IMPRESSIONS:  1.  Recurrent Clostridium difficile colitis.  2.  Gastrointestinal bleeding.  3.  Anemia.  4.  Hypokalemia.  5.  Crohn's disease.   PLAN OF TREATMENT:  1.  The patient will be admitted to medical floor. Will keep n.p.o. except meds. Will start Flagyl and vancomycin p.o., and pain control. Will get a GI consult.  2.  For possible GI bleeding  and anemia, we will start Protonix IV b.i.d., follow up hemoglobin.  3.  For hypokalemia, will give potassium, follow up potassium and magnesium level.    Discussed patient's condition and plan of treatment with patient.   TIME SPENT: About 56 minutes.     ____________________________ Shaune PollackQing Nakeesha Bowler, MD qc:mr D: 06/17/2013 17:38:00 ET T: 06/17/2013 20:25:46 ET JOB#: [id number]  cc: Shaune PollackQing Andrae Claunch, MD, <Dictator> Shaune PollackQING Kym Fenter MD ELECTRONICALLY SIGNED 06/21/2013 12:57

## 2014-05-12 NOTE — Consult Note (Signed)
Chief Complaint:  Subjective/Chief Complaint seen for c diff, history of crohns.  less diarrhea today, continuing with nausea and abdominal pain. denies blood with stool   VITAL SIGNS/ANCILLARY NOTES: **Vital Signs.:   01-Jun-15 13:07  Vital Signs Type Routine  Temperature Temperature (F) 98.1  Celsius 36.7  Temperature Source oral  Pulse Pulse 78  Respirations Respirations 18  Systolic BP Systolic BP 115  Diastolic BP (mmHg) Diastolic BP (mmHg) 69  Mean BP 84  Pulse Ox % Pulse Ox % 97  Pulse Ox Activity Level  At rest  Oxygen Delivery Room Air/ 21 %   Brief Assessment:  Cardiac Regular   Respiratory clear BS   Gastrointestinal details normal Soft  diffuse tenderness, mostly right lower, no rebound positive bowel sounds.   Assessment/Plan:  Assessment/Plan:  Assessment 1) c. diff-on po vanc and flagyl.  with continued nausea will d.c the flagyl.  2) h/o crohns disease-with continued right abd pain, concern for crohns exacerbation.   will order CT abd and pelvis with contrast.   Plan 1)  plan to continue the po vancomycin for a week before starting the Humira back. we are arranging to help him get humira.   as above.   Electronic Signatures: Barnetta ChapelSkulskie, Lisette Mancebo (MD)  (Signed 01-Jun-15 17:28)  Authored: Chief Complaint, VITAL SIGNS/ANCILLARY NOTES, Brief Assessment, Assessment/Plan   Last Updated: 01-Jun-15 17:28 by Barnetta ChapelSkulskie, Geramy Lamorte (MD)

## 2014-05-12 NOTE — H&P (Signed)
PATIENT NAME:  Clayton Ray, Clayton Ray MR#:  [id number] DATE OF BIRTH:  03-19-1985  DATE OF ADMISSION:  06/23/2013  REFERRING PHYSICIAN:  Dr. Governor Rooks.   PRIMARY CARE PHYSICIAN:  Nonlocal.   GASTROENTEROLOGY:  Follows with [facility].   CHIEF COMPLAINT:  Abdominal pain, diarrhea.  HISTORY OF PRESENT ILLNESS:  A 29 year old African American gentleman with past medical history of Crohn's diagnosed about eight years ago as well as recurrent C. diff, presenting with abdominal pain and diarrhea, recently discharged from [facility] with discharge diagnosis of C. diff and Crohn's flare.  Since discharge his diarrhea has actually increased having multiple bouts of watery diarrhea with associated nausea and vomiting which is nonbloody or bilious.  Also, has associated abdominal pain, right lower quadrant in location, crampy in quality, 6 to 7 out of 10 in intensity.  No worsening or relieving factors, nonradiating, with above symptoms he decided to represent to the hospital for further work-up and evaluation.  Thus far has received greater than three doses of pain medications and yet his pain is still poorly controlled.   REVIEW OF SYSTEMS:  CONSTITUTIONAL:  Denies fever.  Positive for fatigue, weakness.  EYES:  Denies blurry vision, double vision, eye pain.  EARS, NOSE, THROAT:  Denies tinnitus, ear pain, hearing loss.  RESPIRATORY:  Denies cough, wheeze, shortness of breath.  CARDIOVASCULAR:  Denies chest pain, palpitations, edema.  GASTROINTESTINAL:  Positive for nausea, vomiting, diarrhea, abdominal pain as described above as well as bright red blood per rectum.  GENITOURINARY:  Denies dysuria, hematuria.  ENDOCRINE:  Denies nocturia, thyroid problems.  HEMATOLOGY AND LYMPHATIC:  Denies easy bruising, bleeding.  SKIN:  Denies rash or lesion.  MUSCULOSKELETAL:  Denies pain in neck, back, shoulder, knees, hips or arthritic symptoms.  NEUROLOGIC:  Denies paralysis, paresthesias.   PSYCHIATRIC:  Denies anxiety or depressive symptoms.  Otherwise, full review of systems performed by me is negative.   PAST MEDICAL HISTORY:  Crohn's diagnosed about eight years ago as well as recurrent C. diff.   SOCIAL HISTORY:  Denies any alcohol, tobacco or drug usage.   FAMILY HISTORY:  Positive for irritable bowel syndrome, however no other family members with Crohn's disease.   ALLERGIES:  AMOXICILLIN, IV CONTRAST DYE, KETOROLAC AND MORPHINE.   HOME MEDICATIONS:  Include acetaminophen oxycodone 325/5 mg by mouth 1 tablet 1 to 3 times daily as needed for pain, Zofran 4 mg by mouth up to 3 times daily as needed for nausea, vomiting, vancomycin 250 mg every six hours on a 12 day course, pantoprazole 40 mg by mouth daily.   PHYSICAL EXAMINATION: VITAL SIGNS:  Temperature 99, heart rate 118, respirations 20, blood pressure 137/70, saturating 97% on room air.  Weight 68 kg, BMI 20.4.  GENERAL:  Well-nourished, well-developed, African American gentleman in minimal distress secondary to abdominal pain.  HEAD:  Normocephalic, atraumatic.  EYES:  Pupils equal, round, reactive to light.  Extraocular muscles intact.  No scleral icterus.  MOUTH:  Moist mucous membranes.  Dentition intact.  No abscess noted.  EAR, NOSE, THROAT:  Clear without exudates.  No external lesions.  NECK:  Supple.  No thyromegaly.  No nodules.  No JVD.  PULMONARY:  Clear to auscultation bilaterally without wheezes, rales or rhonchi.  No use of accessory muscles.  Good respiratory effort.  CHEST:  Nontender to palpation.  CARDIOVASCULAR:  S1, S2, regular rate and rhythm.  No murmurs, rubs, or gallops.  No edema.  Pedal pulse 2+ bilaterally.  GASTROINTESTINAL:  Soft.  Minimal tenderness right lower quadrant without rebound, guarding or motion tenderness.  Nondistended.  Positive bowel sounds.  No hepatosplenomegaly.  MUSCULOSKELETAL:  No swelling, clubbing, edema.  Range of motion full in all extremities.  NEUROLOGIC:   Cranial nerves II through XII intact.  No gross focal neurological deficits.  Sensation intact.  Reflexes intact.  SKIN:  No ulceration, lesions, rash, cyanosis.  Skin warm, dry.  Turgor intact.  PSYCHIATRIC:  Mood and affect within normal limits.  The patient alert and oriented x 3.  Insight and judgment intact.   LABORATORY DATA:  Sodium 142, potassium 3.4, chloride 109, bicarb 26, BUN 10, creatinine 0.73, glucose 80.  WBC 6.3, hemoglobin 7.9, which is relatively stable from where it was on last admission, platelets of 244.  Urinalysis, WBCs of 2, RBCs of 8, negative leukocyte esterase and nitrate.   ASSESSMENT AND PLAN:  A 29 year old [place] gentleman with history of Crohn's and recurrent Clostridium difficile presenting with abdominal pain, diarrhea.  1.  Intractable pain.  As needed pain medications, supportive control, likely related to Clostridium difficile versus Crohn's.  If unimproved with bolus therapy may require a PCA.   2.  Clostridium difficile.  We will continue vancomycin by mouth.  I will re-consult gastroenterology as having recurrence of symptoms.  3.  Crohn's.  Consult gastroenterology, will need long-term medication, previously on Humira, however has not been on this medication secondary to insurance issues.  There are plans in place to restart this after Clostridium difficile treatment has been completed.  4.  Bright red blood per rectum.  Trend complete blood count q. 6 hours, hemoglobin stable from prior admission, transfusion threshold will be hemoglobin less than 7.  5.  Venous thromboembolism prophylaxis with sequential compression devices.  6.  CODE STATUS:  THE PATIENT IS A FULL CODE.   TIME SPENT:  45 minutes.    ____________________________ Cletis Athensavid K. Hower, MD dkh:ea D: 06/23/2013 23:47:26 ET T: 06/24/2013 01:27:15 ET JOB#: [id number]  cc: Cletis Athensavid K. Hower, MD, <Dictator> DAVID Synetta ShadowK HOWER MD ELECTRONICALLY SIGNED 06/24/2013 20:46

## 2014-05-12 NOTE — Consult Note (Signed)
PATIENT NAME:  Clayton Ray, Clayton Ray MR#:  [id number] DATE OF BIRTH:  [birth year]  DATE OF CONSULTATION:  06/18/2013  CONSULTING PHYSICIAN:  Christena DeemMartin U. Oseias Horsey, MD  Patient of Dr. Imogene Burnhen.  REASON FOR CONSULTATION: Clostridium difficile, history of Crohn's disease.   HISTORY OF PRESENT ILLNESS: Mr. Clayton Ray is a 29 year old, African-American male who moved here to this region about 1-1/2 months ago. He states that he has been in training for a new job locally. He states that about 2 days ago he began to have some abdominal pain associated with diarrhea, weakness, and rectal bleeding. The abdominal pain has been on the right side, mostly right lower. He has a fairly complicated history of Crohn's disease, and due to a lapse, apparently, of his insurance, he has not been taking medication, with his last Humira being about 6 weeks ago. He has been on multiple agents in the past. He states that he was on Remicade for about 3 or 4 months. This did not seem to work, and his physicians changed him to Humira. He states that when this diarrhea started up, he felt that it was possibly C. difficile because of his personal knowledge of previous C. difficile infection, as well as the odor that he noted. He states he has had C. difficile 4 times, the last time about 6 to 7 months ago. In regards to his history of Crohn's disease, he has had surgery to affect the terminal ileum and right colon in about 2009. He had a repeat surgery in 2013. He has a history of fistulas connecting the bladder and intestine. He has a history of anemia. He has been found to be positive for C. diff on this hospitalization. However, he was positive for C. diff toxigenic strain. However, there was no evidence of toxins.   PAST MEDICAL HISTORY: History of chronic anemia. The patient has been told that he has a history of chronic anemia due to his problems with Crohn's disease. He states that he has had multiple transfusions in this regard, usually up  to about every 6 months. However, states that his last one was about a year to a year and a half ago.   GI FAMILY HISTORY: Negative for colorectal cancer, liver disease, or ulcers.   REVIEW OF SYSTEMS:  Ten systems reviewed, per admission history and physical, agree with same.   PHYSICAL EXAMINATION: VITAL SIGNS: Temperature is 98.1, pulse 72, respirations 18, blood pressure 130/81, pulse ox is 99%.  GENERAL: He is an anxious, 29 year old African-American male in no acute distress.  HEENT: Normocephalic, atraumatic. Eyes are anicteric. Nose:  Septum midline, no lesions.  Oropharynx: No lesions.  NECK: Supple. No JVD.  HEART: Regular rate and rhythm, without rub or gallop.  LUNGS: Bilaterally clear.  ABDOMEN: Soft. He has a midline scar consistent with his surgical history. He is tender in the right side of the abdomen, mostly right lower quadrant. There are no masses or rebound. Bowel sounds are positive.  RECTAL: Anorectal exam deferred.  EXTREMITIES: No clubbing, cyanosis, or edema. It is of note that patient states that every now and then he will have swelling in his lower extremities and in his hands.  NEUROLOGIC: Cranial nerves II through XII grossly intact. Muscle strength bilaterally equal and symmetric. DTRs bilaterally equal and symmetric.   LABORATORIES INCLUDE THE FOLLOWING: Yesterday, labs included a glucose of 81, BUN 4, creatinine 0.73, sodium 142, potassium 3.2, chloride 106, bicarb 32, magnesium 1.9, lipase 261. Positive C. diff as above. LFTs  were normal. His AST and ALT were 8 and 11, respectively. His hemogram on admission showed a white count of 4.6, H and H of 7.6/25.3, platelet count of 237, MCV is 78, MCH of 23.4. His C. diff again was positive for toxigenic C. difficile, active toxin production not detected. UA was negative. Repeat laboratories today showed a hemoglobin of 7.7, WBC 4.7. He has had no imaging studies.   ASSESSMENT:  1.  Positive Clostridium difficile  toxigenic species. The toxin assay was negative. However, this may be simply that the toxin was below the level of sensitivity for the test, or in light of his previous history of C. difficile infection, he could be a carrier. The patient states that he has started feeling a bit better with initiation of antibiotic treatment. He is currently on IV Flagyl and oral vancomycin.  2.  History of complicated Crohn's disease. He has had issues with medical compliance. I believe these have been mostly related to insurance coverage issues. He is not currently on medication for his Crohn's disease. I am uncertain as to whether this could be involved with a Crohn's exacerbation currently, but seems to be more so related with C. difficile.   RECOMMENDATIONS: 1.  Continue current treatment for C. difficile. I will continue the IV Flagyl for now, although this is not a necessary drug in regards to his C. difficile, as he is on oral vancomycin. However, oral vancomycin is not considered an adjunct for Crohn's disease, whereas Flagyl is considered as an adjunct. I would continue both of these medications for the next few days, and then discontinue the IV Flagyl, continuing the oral vancomycin to a course of 10 days at 250 mg q.i.d., then decreasing in a tapering manner over the next 10 days or so.   2. Will hold on restarting his Crohn's therapy at this point until there is some improvement in regards to the C. diff. Will recommend imaging, CT scan with contrast over the course of the next day or two. I would like to obtain records from his previous GI physicians in regards to evaluations, particularly prior to the beginning of biologic agents. The patient had some knowledge of Imuran, although he states he has not taken this in the past. This was apparently considered as an alternative treatment for him, and we will follow up on this as well.   ____________________________ Christena Deem, MD mus:mr D: 06/18/2013  17:02:06 ET T: 06/18/2013 17:22:05 ET JOB#: [id number]  cc: Christena Deem, MD, <Dictator>  Christena Deem MD ELECTRONICALLY SIGNED 07/13/2013 9:09

## 2014-05-12 NOTE — Discharge Summary (Signed)
Dates of Admission and Diagnosis:  Date of Admission 17-Jun-2013   Date of Discharge 21-Jun-2013   Admitting Diagnosis c diff colitis   Final Diagnosis C diff colitis Crohn's dz- no flare Blood in stool- due to C diff- hb stable. Hypokalemia    Chief Complaint/History of Present Illness A 29 year old African-American male with a history of Crohn's disease, fistula of bladder, recurrent C. diff colitis, presented to the ED with abdominal pain, nausea and vomiting for 2 days. The patient is alert, awake, oriented, in no acute distress. The patient started to have abdominal pain 2 days ago, which is constant, sharp, 8 out of 10, initially intermittent, then became constant, associated with nausea, vomiting multiple times, and also diarrhea for the past 2 days. In addition, patient noticed bloody stool today. The patient also complains of fever, chills, and weakness, unable to eat. The patient lost weight, 5 pounds in the past one month. The patient???s stool C. difficile test is positive.   Allergies:  Morphine: Rash  Ketorolac: Rash  IVP Dye: Rash  Amoxicillin: Unknown  Hepatic:  30-May-15 15:24   Bilirubin, Total 0.4  Alkaline Phosphatase 59 (45-117 NOTE: New Reference Range 12/09/12)  SGPT (ALT)  11  SGOT (AST)  8  Total Protein, Serum 6.8  Albumin, Serum 3.6  Routine Micro:  30-May-15 13:53   Micro Text Report CLOSTRIDIUM DIFFICILE   C.DIFFICILE ANTIGEN       C.DIFFICILE GDH ANTIGEN : POSITIVE   C.DIFFICILE TOXIN A/B     C.DIFFICILE TOXINS A AND B : NEGATIVE   PCR FOR TOXIGENIC C.DIFF  PCR FOR TOXIGENIC C.DIFFICILE : POSITIVE   INTERPRETATION Positive for toxigenic C. difficile, active toxin production NOT detected.  Patient has toxigenic C. difficile organisms present in the bowel, but toxin was not detected.  The patient may be a carrier or the level of toxin in the sample was below the limit of detection. This information should be used in conjunction with the patient's  clinical history when deciding on possible therapy.   ANTIBIOTIC                       Routine Chem:  30-May-15 15:24   Glucose, Serum 81  BUN  4  Creatinine (comp) 0.73  Sodium, Serum 142  Potassium, Serum  3.2  Chloride, Serum 106  CO2, Serum 32  Calcium (Total), Serum 8.8  Anion Gap  4  Osmolality (calc) 279  eGFR (African American) >60  eGFR (Non-African American) >60 (eGFR values <42m/min/1.73 m2 may be an indication of chronic kidney disease (CKD). Calculated eGFR is useful in patients with stable renal function. The eGFR calculation will not be reliable in acutely ill patients when serum creatinine is changing rapidly. It is not useful in  patients on dialysis. The eGFR calculation may not be applicable to patients at the low and high extremes of body sizes, pregnant women, and vegetarians.)  Magnesium, Serum 1.9 (1.8-2.4 THERAPEUTIC RANGE: 4-7 mg/dL TOXIC: > 10 mg/dL  -----------------------)  Lipase 261 (Result(s) reported on 17 Jun 2013 at 03:44PM.)  Routine Hem:  30-May-15 15:24   WBC (CBC) 4.6  RBC (CBC)  3.25  Hemoglobin (CBC)  7.6  Hematocrit (CBC)  25.3  Platelet Count (CBC) 237 (Result(s) reported on 17 Jun 2013 at 03:39PM.)  MCV  78  MCH  23.4  MCHC  30.1  RDW  18.4   PERTINENT RADIOLOGY STUDIES: CT:    02-Jun-15 13:58, CT Abdomen and Pelvis Without Contrast  CT Abdomen and Pelvis Without Contrast   REASON FOR EXAM:    (1) Abdominal pain, nausea, vomiting; (2) abdominal   pain, nausea, vomiting  COMMENTS:       PROCEDURE: CT  - CT ABDOMEN AND PELVIS W0  - Jun 20 2013  1:58PM     CLINICAL DATA:  Abdominal pain, Crohn's disease. Reported history of  IV contrast allergy    EXAM:  CT ABDOMEN AND PELVIS WITHOUT CONTRAST    TECHNIQUE:  Multidetector CT imaging of the abdomen and pelvis was performed  following the standard protocol without IV contrast.  COMPARISON:  CT 06/09/2013    FINDINGS:  Lung bases are clear.  No pericardial  fluid.    No focal hepatic lesions non contrast exam. The pancreas,  gallbladder, spleen, adrenal glands, kidneys are normal.    Stomach duodenum appear normal. The jejunum and ileum appear normal.  There is no mucosal irregularity evident. No evidence of edema or  bowel wall thickening. There is a surgical anastomosis in the right  lower quadrant with partial right colectomy. No evidence nodularity,  stricture, or obstruction at the anastomosis. There is a rounded  lymph node in the ileocecal mesenteric measuring 12 mm (image 45,  series 2). Neck evidence of inflammation or obstruction of the  colon. Moderate volume of stool in the rectum.    Abdominal or is normal caliber. No retroperitoneal periportal  lymphadenopathy.    No free fluid the pelvis. No pelvic lymphadenopathy. Prostate gland  and bladder normal.     IMPRESSION:  1. No evidence of active Crohn's disease.  No fistula or abscess.  2. Enteric colonic anastomosis in the right lower quadrant without  complication.  3. Single lymph node in the ileocecal mesentery is nonspecific.  Marland Kitchen    Electronically Signed    By: Suzy Bouchard M.D.    On: 06/20/2013 14:04         Verified By: Rennis Golden, M.D.,   Pertinent Past History:  Pertinent Past History Crohn's disease, fistula of intestine and bladder, recurrent C. diff colitis.   Hospital Course:  Hospital Course * C diff colitis   Oral vanc- appreciated Gi. d/ced flagyl due to nausea.   reviewed CT.    better now- d/c home.    * Crohn'z dz   not on treatment   Gi consult appreciated- restart humira after c diff treated.   will follow in clinic for it.  * Ac on ch anemia- due to blood loss in stool- in c diff   Consult GI- monitor- stable.  * Hypokalemia- replace.   Condition on Discharge Stable   Code Status:  Code Status Full Code   DISCHARGE INSTRUCTIONS HOME MEDS:  Medication Reconciliation: Patient's Home Medications at Discharge:      Medication Instructions  acetaminophen-oxycodone 325 mg-5 mg oral tablet  1 tab(s) orally 1 to 3 times a day x 6 days, As Needed, pain , As needed, pain   ondansetron 4 mg oral tablet, disintegrating  1 tab(s) orally 3 times a day x 6 days, As Needed - for Nicotine Craving - for Nausea, Vomiting    vancomycin 250 mg/5 ml oral solution  5 milliliter(s) orally every 6 hours x 12 days   pantoprazole 40 mg oral delayed release tablet  1 tab(s) orally once a day    STOP TAKING THE FOLLOWING MEDICATION(S):    multivitamin: 1 tab(s) orally once a day vitamin b12 1000 mcg oral tablet: 1 tab(s)  orally once a day  Physician's Instructions:  Home Health? No   Diet Regular   Activity Limitations As tolerated   Return to Work Not Applicable   Time frame for Follow Up Appointment 1-2 weeks  Gi clinic next week.   Other Comments Need to start Humira once visiteed Gi clinic.     Gustavo Lah, Martin(Consultant): [facility], Department of Gastroenterology, [place], [place], [address] [address], [place]  Electronic Signatures: Vaughan Basta (MD)  (Signed 04-Jun-15 18:38)  Authored: ADMISSION DATE AND DIAGNOSIS, CHIEF COMPLAINT/HPI, Allergies, PERTINENT LABS, PERTINENT RADIOLOGY STUDIES, PERTINENT PAST HISTORY, HOSPITAL COURSE, [place], PATIENT INSTRUCTIONS, Follow Up Physician   Last Updated: 04-Jun-15 18:38 by Vaughan Basta (MD)

## 2014-05-12 NOTE — Discharge Summary (Signed)
PATIENT NAME:  Clayton Ray, [name] MR#:  [id number] DATE OF BIRTH:  [birth year]  DATE OF ADMISSION:  06/24/2013 DATE OF DISCHARGE:  06/24/2013  The patient left against medical advice on 06/24/2013.  HOSPITAL COURSE: The patient was originally admitted on 06/23/2013 by myself. At the time, he was presenting with abdominal pain and melena and positive C. diff. He was placed on pain medications to help control his symptoms. He was also provided IV fluids to aid in any dehydration that was possible given history, although he had no laboratory work or objective findings of dehydration at this time. We continued antibiotics, vancomycin, for his C. diff. Gastroenterology consulted. He however as the night progressed apparently became more irritable. Discussed his medical care and case with Dr. Amado CoeGouru and told her apparently that "I have to leave the hospital for personal issues." It was explained to him that this was against medical advice and he should stay in the hospital and receive appropriate treatment, for both his pain as well as his known C. diff infection. He however decided to leave against medical advice on the morning of 06/24/2013.   ____________________________ Cletis Athensavid K. Hower, MD dkh:sb D: 07/12/2013 18:40:00 ET T: 07/13/2013 09:18:25 ET JOB#: [id number]  cc: Cletis Athensavid K. Hower, MD, <Dictator> DAVID Synetta ShadowK HOWER MD ELECTRONICALLY SIGNED 07/19/2013 21:37

## 2014-05-12 NOTE — Consult Note (Signed)
Brief Consult Note: Diagnosis: Patient with a history of Crohn's disease and recent C. diff positive 7 days ago. Has been on treatment for this. now with continued abd pain.   Patient was seen by consultant.   Consult note dictated.   Comments: Patient should have c. diff rechecked. If psoitive then continue treatment with Vanco and Flagyl. If negative then will consider treatment of the Crohn's with steroids.  Electronic Signatures: Midge MiniumWohl, Makaylen Thieme (MD)  (Signed 07-Jun-15 09:33)  Authored: Brief Consult Note   Last Updated: 07-Jun-15 09:33 by Midge MiniumWohl, Filippo Puls (MD)

## 2014-05-12 NOTE — Discharge Summary (Signed)
PATIENT NAME:  Clayton Ray, [name] MR#:  [id number] DATE OF BIRTH:  1985/11/11  DATE OF ADMISSION:  06/24/2013 DATE OF DISCHARGE:  06/25/2013  The patient left AGAINST MEDICAL ADVICE on the evening of 06/25/2013.   For a detailed note, please see the history and physical done on admission by Dr. Angelica Ranavid Hower.   BRIEF HOSPITAL COURSE: This is a 29 year old male with a history of Crohn disease and a recent history of C. difficile colitis who presented to the hospital with worsening diarrhea and abdominal pain. The patient was being treated in the hospital for possible underlying C. difficile with oral vancomycin and IV Flagyl. The patient was requiring high doses of IV pain medications and showing signs of high drug-seeking behavior. His pain medications were therefore tapered.   The patient was recently admitted to the hospital for similar symptoms and treated for underlying C. difficile and discharged on some oral Percocet.  Upon tapering his IV pain medications the patient was quite upset and wanted his pain medications to be given more frequently. The patient was adequately taking p.o. and his vitals were stable. He was afebrile with a normal white cell count. He was anemic, but that was chronic in nature for the patient. The patient; therefore, left AGAINST MEDICAL ADVICE on the evening of 06/25/2013. The next time this patient  comes in the Emergency Room or get evaluated, he should not be offered any IV pain medications as he has significant drug-seeking behavior.   ____________________________ Rolly PancakeVivek J. Cherlynn KaiserSainani, MD vjs:sg D: 06/26/2013 08:34:43 ET T: 06/26/2013 09:22:20 ET JOB#: [id number]  cc: Rolly PancakeVivek J. Cherlynn KaiserSainani, MD, <Dictator> Houston SirenVIVEK J SAINANI MD ELECTRONICALLY SIGNED 07/18/2013 20:25

## 2014-05-12 NOTE — Discharge Summary (Signed)
PATIENT NAME:  Clayton Ray, Clayton Ray MR#:  [id number] DATE OF BIRTH:  [birth year]  DATE OF ADMISSION:  06/23/2013 DATE OF DISCHARGE:  06/24/2013  The patient left against medical advice on 06/24/2013.  HOSPITAL COURSE: The patient was originally admitted on 06/23/2013 by myself. At the time, he was presenting with abdominal pain and melena and positive C. diff. He was placed on pain medications to help control his symptoms. He was also provided IV fluids to aid in any dehydration that was possible given history, although he had no laboratory work or objective findings of dehydration at this time. We continued antibiotics, vancomycin, for his C. diff. Gastroenterology consulted. He however as the night progressed apparently became more irritable. Discussed his medical care and case with Dr. Amado CoeGouru and told her apparently that "I have to leave the hospital for personal issues." It was explained to him that this was against medical advice and he should stay in the hospital and receive appropriate treatment, for both his pain as well as his known C. diff infection. He however decided to leave against medical advice on the morning of 06/24/2013.   ____________________________ Cletis Athensavid K. Hower, MD dkh:sb D: 07/12/2013 18:40:00 ET T: 07/13/2013 09:18:25 ET JOB#: [id number]  cc: Cletis Athensavid K. Hower, MD, <Dictator> DAVID Synetta ShadowK HOWER MD ELECTRONICALLY SIGNED 07/19/2013 21:37

## 2015-01-13 ENCOUNTER — Inpatient Hospital Stay
Admit: 2015-01-13 | Discharge: 2015-01-13 | Disposition: A | Discharge status: Home / Self Care | Payer: PRIVATE HEALTH INSURANCE | Attending: Emergency Medicine

## 2015-01-13 DIAGNOSIS — R109 Unspecified abdominal pain: Secondary | ICD-10-CM

## 2015-01-13 LAB — METABOLIC PANEL, COMPREHENSIVE
A-G Ratio: 1.1 (ref 1.0–3.1)
ALT (SGPT): 16 U/L (ref 12.0–78.0)
AST (SGOT): 11 U/L — ABNORMAL LOW (ref 15–37)
Albumin: 3.7 g/dL (ref 3.40–5.00)
Alk. phosphatase: 59 U/L (ref 46–116)
Anion gap: 13 mmol/L (ref 10–17)
BUN/Creatinine ratio: 22 — ABNORMAL HIGH (ref 6.0–20.0)
BUN: 17 MG/DL (ref 7–18)
Bilirubin, total: 0.2 MG/DL (ref 0.20–1.00)
CO2: 29 mmol/L (ref 21–32)
Calcium: 8.6 MG/DL (ref 8.5–10.1)
Chloride: 105 mmol/L (ref 98–107)
Creatinine: 0.77 MG/DL (ref 0.6–1.3)
GFR est AA: >60 mL/min/{1.73_m2} (ref >60)
GFR est non-AA: >60 mL/min/{1.73_m2} (ref >60)
Globulin: 3.3 g/dL
Glucose: 85 mg/dL (ref 74–106)
Potassium: 3.7 mmol/L (ref 3.50–5.10)
Protein, total: 7 g/dL (ref 6.40–8.20)
Sodium: 143 mmol/L (ref 136–145)

## 2015-01-13 LAB — CBC WITH AUTOMATED DIFF
ABS. BASOPHILS: 0 10*3/uL (ref 0.0–0.2)
ABS. EOSINOPHILS: 0.1 10*3/uL (ref 0.0–0.7)
ABS. LYMPHOCYTES: 2.3 10*3/uL (ref 1.2–3.4)
ABS. MONOCYTES: 0.8 10*3/uL — ABNORMAL LOW (ref 1.1–3.2)
ABS. NEUTROPHILS: 3.9 10*3/uL (ref 1.4–6.5)
BASOPHILS: 0 % (ref 0–2)
EOSINOPHILS: 2 % (ref 0–5)
HCT: 31.1 % — ABNORMAL LOW (ref 36.8–45.2)
HGB: 9.9 g/dL — ABNORMAL LOW (ref 12.8–15.0)
IMMATURE GRANULOCYTES: 0.3 % (ref 0.0–5.0)
LYMPHOCYTES: 33 % (ref 16–40)
MCH: 27.7 PG (ref 27–31)
MCHC: 31.8 g/dL — ABNORMAL LOW (ref 32–36)
MCV: 86.9 FL (ref 81–99)
MONOCYTES: 11 % (ref 0–12)
MPV: 9.9 FL (ref 7.4–10.4)
NEUTROPHILS: 54 % (ref 40–70)
PLATELET: 279 10*3/uL (ref 140–450)
RBC: 3.58 M/uL — ABNORMAL LOW (ref 4.0–5.2)
RDW: 16.4 % — ABNORMAL HIGH (ref 11.5–14.5)
WBC: 7.2 10*3/uL (ref 4.8–10.8)

## 2015-01-13 LAB — LIPASE: Lipase: 186 U/L (ref 65–230)

## 2015-01-13 MED ORDER — SODIUM CHLORIDE 0.9% BOLUS IV
0.9 % | Freq: Once | INTRAVENOUS | Status: AC
Start: 2015-01-13 — End: 2015-01-13
  Administered 2015-01-13: 08:00:00 via INTRAVENOUS

## 2015-01-13 MED ORDER — METHYLPREDNISOLONE (PF) 125 MG/2 ML IJ SOLR
125 mg/2 mL | Freq: Once | INTRAMUSCULAR | Status: AC
Start: 2015-01-13 — End: 2015-01-13
  Administered 2015-01-13: 09:00:00 via INTRAVENOUS

## 2015-01-13 MED ORDER — KETAMINE 50 MG/ML IJ SOLN
50 mg/mL | Freq: Once | INTRAMUSCULAR | Status: DC
Start: 2015-01-13 — End: 2015-01-13

## 2015-01-13 MED ORDER — PANTOPRAZOLE 40 MG IV SOLR
40 mg | INTRAVENOUS | Status: AC
Start: 2015-01-13 — End: 2015-01-13
  Administered 2015-01-13: 08:00:00 via INTRAVENOUS

## 2015-01-13 MED ORDER — SODIUM CHLORIDE 0.9% BOLUS IV
0.9 % | Freq: Once | INTRAVENOUS | Status: DC
Start: 2015-01-13 — End: 2015-01-13

## 2015-01-13 MED ORDER — KETAMINE 100 MG/ML IJ SOLN
100 mg/mL | INTRAMUSCULAR | Status: DC
Start: 2015-01-13 — End: 2015-01-13

## 2015-01-13 MED ORDER — ONDANSETRON (PF) 4 MG/2 ML INJECTION
4 mg/2 mL | INTRAMUSCULAR | Status: AC
Start: 2015-01-13 — End: 2015-01-13
  Administered 2015-01-13: 08:00:00 via INTRAVENOUS

## 2015-01-13 MED ORDER — LORAZEPAM 2 MG/ML IJ SOLN
2 mg/mL | Freq: Once | INTRAMUSCULAR | Status: AC
Start: 2015-01-13 — End: 2015-01-13
  Administered 2015-01-13: 11:00:00 via INTRAVENOUS

## 2015-01-13 MED ORDER — KETAMINE 50 MG/ML IJ SOLN
50 mg/mL | Freq: Once | INTRAMUSCULAR | Status: AC
Start: 2015-01-13 — End: 2015-01-13
  Administered 2015-01-13: 10:00:00 via ORAL

## 2015-01-13 MED ORDER — SODIUM CHLORIDE 0.9 % IJ SYRG
INTRAMUSCULAR | Status: DC | PRN
Start: 2015-01-13 — End: 2015-01-13

## 2015-01-13 MED ORDER — HALOPERIDOL LACTATE 5 MG/ML IJ SOLN
5 mg/mL | INTRAMUSCULAR | Status: AC
Start: 2015-01-13 — End: 2015-01-13
  Administered 2015-01-13: 11:00:00 via INTRAMUSCULAR

## 2015-01-13 MED FILL — SOLU-MEDROL (PF) 125 MG/2 ML SOLUTION FOR INJECTION: 125 mg/2 mL | INTRAMUSCULAR | Qty: 2

## 2015-01-13 MED FILL — HALOPERIDOL LACTATE 5 MG/ML IJ SOLN: 5 mg/mL | INTRAMUSCULAR | Qty: 1

## 2015-01-13 MED FILL — PROTONIX 40 MG INTRAVENOUS SOLUTION: 40 mg | INTRAVENOUS | Qty: 40

## 2015-01-13 MED FILL — KETAMINE 50 MG/ML IJ SOLN: 50 mg/mL | INTRAMUSCULAR | Qty: 10

## 2015-01-13 MED FILL — ONDANSETRON (PF) 4 MG/2 ML INJECTION: 4 mg/2 mL | INTRAMUSCULAR | Qty: 2

## 2015-01-13 MED FILL — SODIUM CHLORIDE 0.9 % IV: INTRAVENOUS | Qty: 1000

## 2015-01-13 MED FILL — LORAZEPAM 2 MG/ML IJ SOLN: 2 mg/mL | INTRAMUSCULAR | Qty: 1

## 2015-01-13 NOTE — ED Notes (Signed)
Assumed care of patient continuing with plan of care fluids infusing no acute distress

## 2015-01-13 NOTE — ED Notes (Signed)
Patient c/o itching to right hand and forearm during administration of protonix. IVP stopped, approximately 10mg  (of of 40mg ) given. Urticaria visible to portion of thumb directly proximal to insertion site. Physician notified.

## 2015-01-13 NOTE — ED Notes (Signed)
[facility] Pt resting in room will continue to monitor.

## 2015-01-13 NOTE — ED Notes (Signed)
MD Igbani at bedside to administer ketamine to patient, tolerating procedure well

## 2015-01-13 NOTE — ED Triage Notes (Signed)
Patient states he has a diagnosis of Crohns, feels like this is an exacerbation. Reports n/v/d, abdominal pain, blood in stool

## 2015-01-13 NOTE — ED Provider Notes (Signed)
The history is provided by the patient and medical records.          29 year old male with past medical history of Crohn    disease, status post bowel resection x2, history of narcotic abuse,  crohns disease, believes flare. Reports prior hx of c diff, and states has had bloody bowel movements in the past and has had vomiting x4 non bloody gastric today. States came to [facility] because he was at hs grandmothers for Christmas and didn't want to drive to far to [facility]. Of note No prior ED visits seen.   Was seen on nov 26 [facility], left ama for pain related concern.  Narcotic seeking behaior documented on 11/14/2014 at [facility] [address].              Past Medical History:   Diagnosis Date   ??? Gastrointestinal disorder      crohns   ??? Ill-defined condition      Anemia       Past Surgical History:   Procedure Laterality Date   ??? Hx colectomy     ??? Hx appendectomy     ??? Hx small bowel resection           History reviewed. No pertinent family history.    Social History     Social History   ??? Marital status: SINGLE     Spouse name: N/A   ??? Number of children: N/A   ??? Years of education: N/A     Occupational History   ??? Not on file.     Social History Main Topics   ??? Smoking status: Never Smoker   ??? Smokeless tobacco: Not on file   ??? Alcohol use No   ??? Drug use: No   ??? Sexual activity: Not on file     Other Topics Concern   ??? Not on file     Social History Narrative   ??? No narrative on file         ALLERGIES: Amoxicillin; Iodinated contrast media - oral and iv dye; and Toradol [ketorolac tromethamine]    Review of Systems   Constitutional: Negative for activity change and fever.   HENT: Negative for ear pain and sore throat.    Eyes: Negative for pain.   Respiratory: Negative for cough and shortness of breath.    Cardiovascular: Negative for chest pain.   Gastrointestinal: Positive for abdominal pain, blood in stool, nausea and vomiting. Negative for diarrhea.    Genitourinary: Negative for difficulty urinating, discharge, dysuria, penile pain and testicular pain.   Musculoskeletal: Negative for back pain and neck pain.   Skin: Negative for rash.   Neurological: Negative for weakness and headaches.       Vitals:    01/13/15 0100   BP: 111/61   Pulse: 76   Resp: 20   Temp: 98.3 ??F (36.8 ??C)   SpO2: 100%   Weight: 65.8 kg (145 lb)   Height: 6' (1.829 m)            Physical Exam   Constitutional: He is oriented to person, place, and time. He appears well-developed and well-nourished.  Non-toxic appearance. He does not have a sickly appearance. He does not appear ill. He appears distressed.   Mild distress appearance  Laying flat on stretcher   Distracted using cell phone     HENT:   Head: Normocephalic.   Right Ear: External ear normal.   Left Ear: External  ear normal.   Eyes: Conjunctivae and EOM are normal. Pupils are equal, round, and reactive to light.   Neck: Normal range of motion. Neck supple. No tracheal deviation present.   Cardiovascular: Normal rate, regular rhythm and normal heart sounds.    Pulmonary/Chest: Effort normal and breath sounds normal.   Abdominal: Soft. Bowel sounds are normal.       Genitourinary: Penis normal.   Musculoskeletal: Normal range of motion.   Neurological: He is alert and oriented to person, place, and time. He has normal reflexes.   Skin: Skin is warm and dry.   Psychiatric: His behavior is normal. Thought content normal. He exhibits a depressed mood.   Defensive    Nursing note and vitals reviewed.       MDM  Number of Diagnoses or Management Options  Abdominal pain, unspecified location:   Crohn's disease without complication, unspecified gastrointestinal tract location [facility](HCC):   Diagnosis management comments: 29 year old male with past medical history of Crohn  disease, status post bowel resection x2, history of narcotic abuse, crohns disease presenting to ED for chronic abdominal pain. Per  chart review and non toxic presentation, no nausea vomiting in ED and stable labs with no signs of electrolyte imbalance, concern sx may be due to chronic pain. I discussed with patient no narcotics will be given for chronic pain and discussed Dilaudid free with patient. With lenghty discussion patient was receptive to alternative treatment. IV hydration and steroids anti-emetics given. 1x Ketamine 50 mg IN was attempted with no relief of symptoms.  Patinet was thus given Haldol 5mg  and Ativan 2 mg for nausea and pain and was signed out to oncoming provider        ED Course       Procedures

## 2015-01-13 NOTE — ED Notes (Signed)
Discharged pt. Per orders. Pt. Verbalized understanding of home care and follow up. Pt alert and oriented x 4. Pt called for transport home and verbalized understanding to follow up with PCP as needed. Pt ambulates without distress.

## 2015-01-13 NOTE — ED Notes (Signed)
Pt resting no distress continuing plan of care as ordered

## 2015-01-13 NOTE — ED Notes (Signed)
Pt woke up and requested urinal. Made MD aware. Will reassess alertness/readiness for discharge.

## 2015-01-13 NOTE — ED Notes (Signed)
[facility]

## 2015-01-13 NOTE — ED Notes (Signed)
7:15 AM   This patient was signed out to me by [facility]. I have seen and assessed the patient myself. I agree with the assessment and plan.    The current plan is to continue monitor the pt and reassess. Plan for discharge.     10:40 AM  Pain is improved. No further nausea or vomiting. He is awake, alert, and ready for discharge. Discussed pain management regimen and offered prescriptions for motrin, tylenol, and antiemetic, however he declines. [facility] and will f/u wth her next week.   -----  7:24 AM  Documented by Ileene RubensLashaunda B. Johnson, acting as a scribe for Dr. Armida SansMichelle Igbani, MD    PROVIDER ATTESTATION:  2:20 PM    The entirety of this note, signed by me, accurately reflects all works, treatments, procedures, and medical decision making performed by me, Windle Guardaniel J Asaiah Scarber, MD.

## 2015-01-27 IMAGING — CT CT ABD-PELV W/O CM
2 of 4 series · 15 of 46 positions shown, 17 images · non-contrast
Comparison: CT 06/09/2013

CLINICAL DATA: Abdominal pain, Crohn's disease. Reported history of
IV contrast allergy

EXAM:
CT ABDOMEN AND PELVIS WITHOUT CONTRAST
TECHNIQUE: Multidetector CT imaging of the abdomen and pelvis was performed
following the standard protocol without IV contrast.

[Series 2: routine abd pel without · axial · non-contrast · 0.58mm/px · z∈[-964,-534]mm · 12 of 98 slices shown, 14 images]
[im 8/98  soft-tissue]
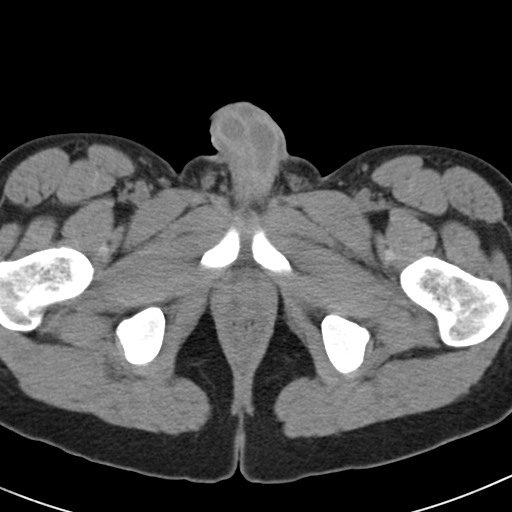
[im 8/98  bone]
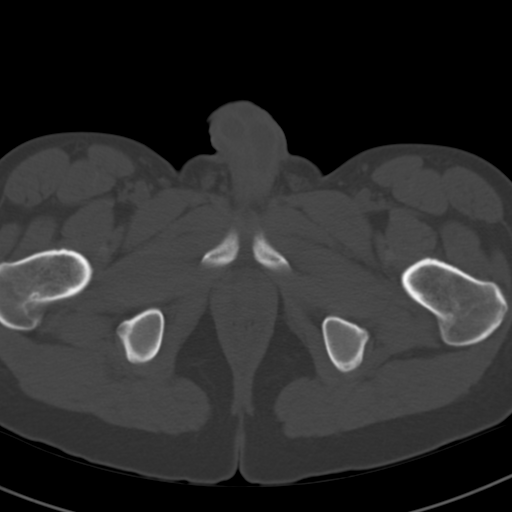
[im 16/98  soft-tissue]
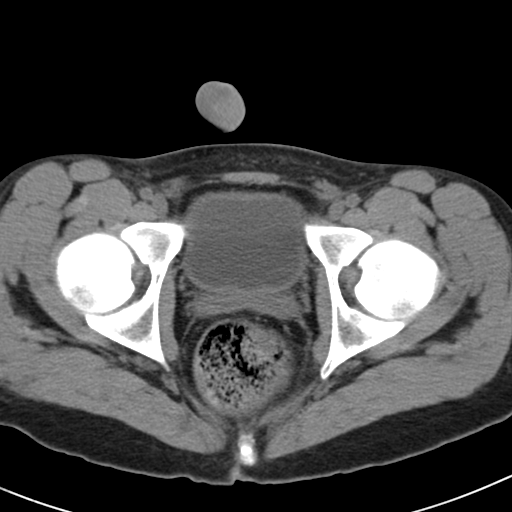
[im 24/98  soft-tissue]
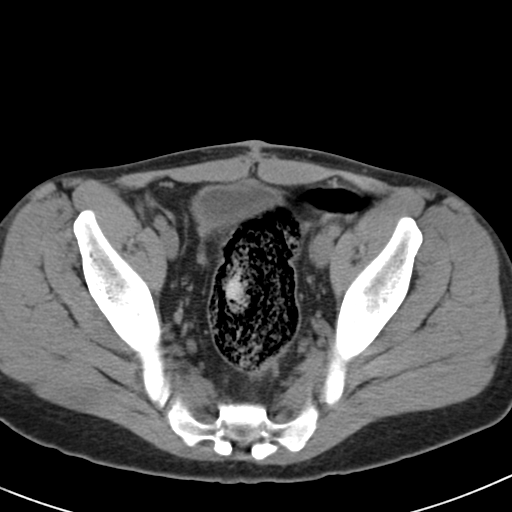
[im 32/98  soft-tissue]
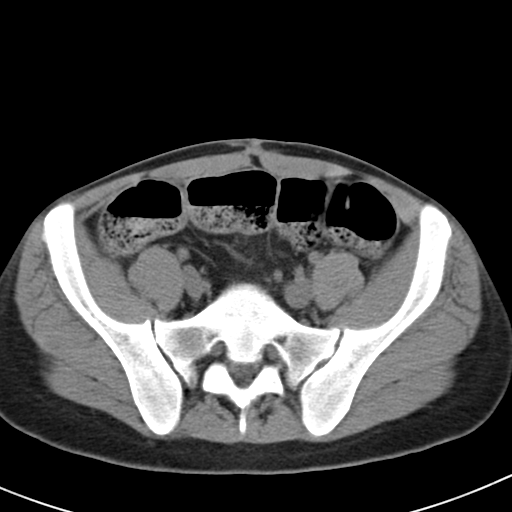
[im 39/98  soft-tissue]
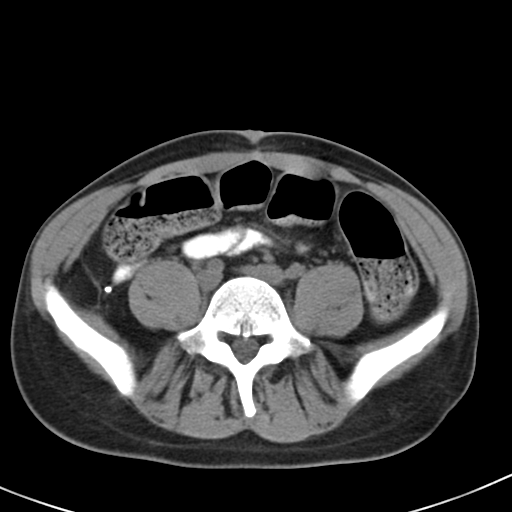
[im 47/98  soft-tissue]
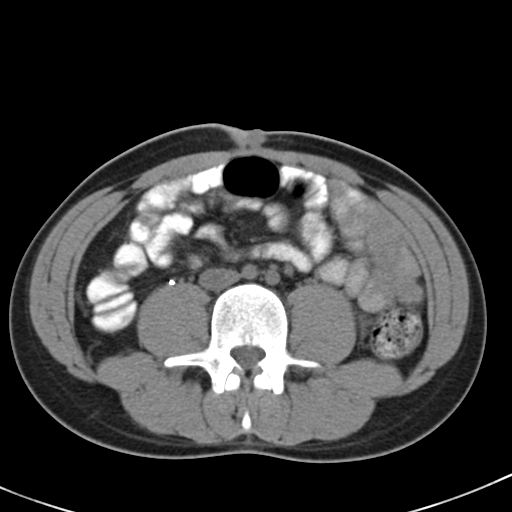
[im 55/98  soft-tissue]
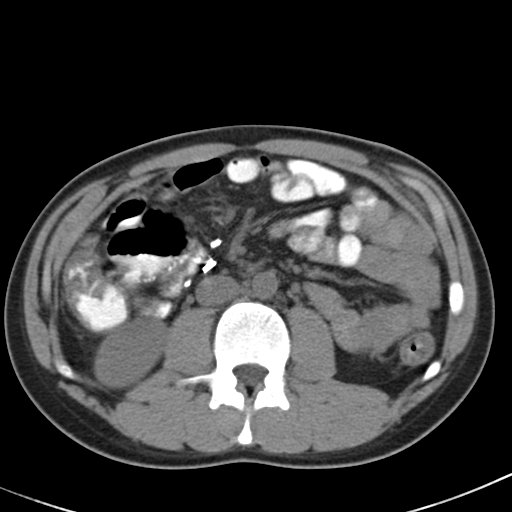
[im 63/98  soft-tissue]
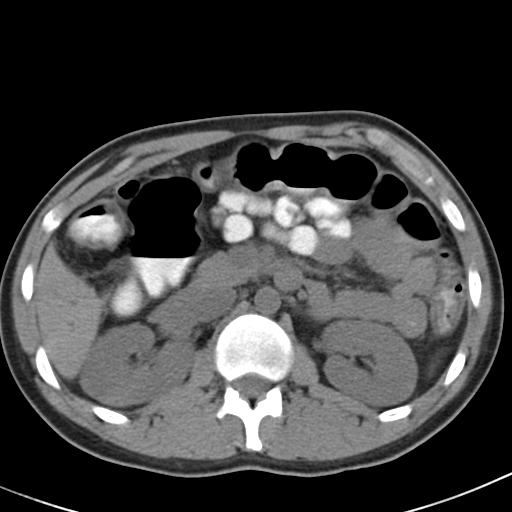
[im 70/98  soft-tissue]
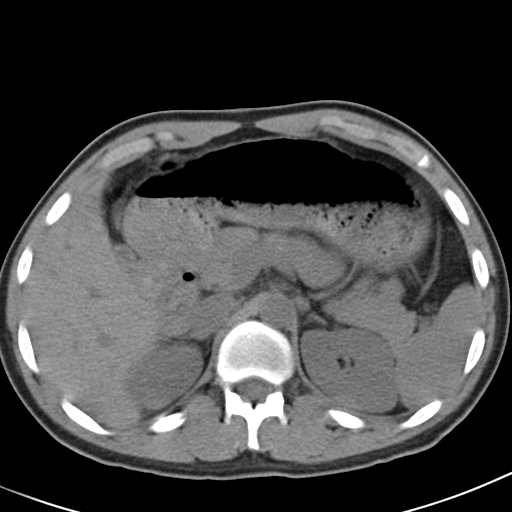
[im 70/98  bone]
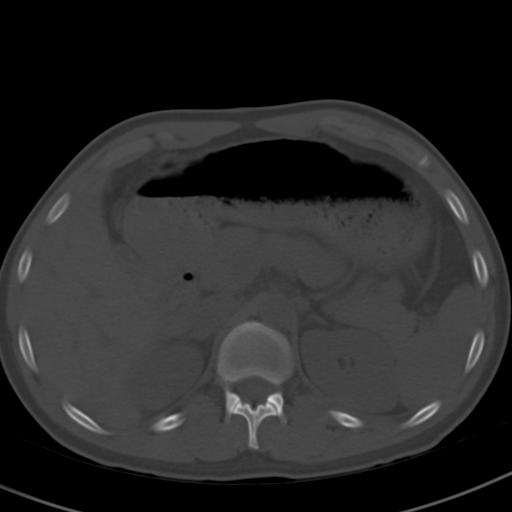
[im 78/98  soft-tissue]
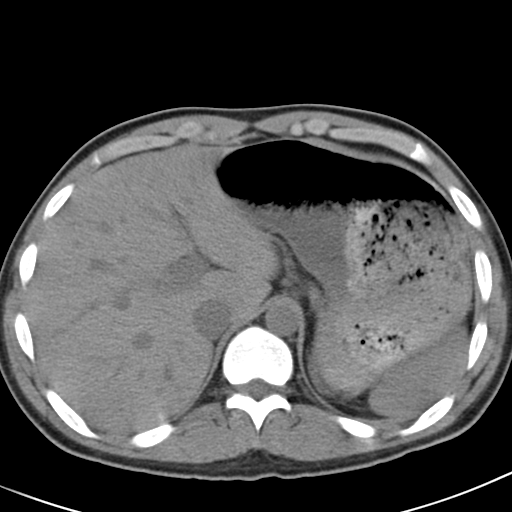
[im 86/98  soft-tissue]
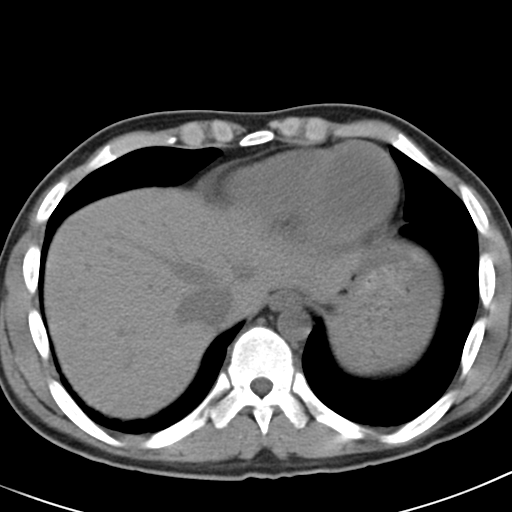
[im 94/98  soft-tissue]
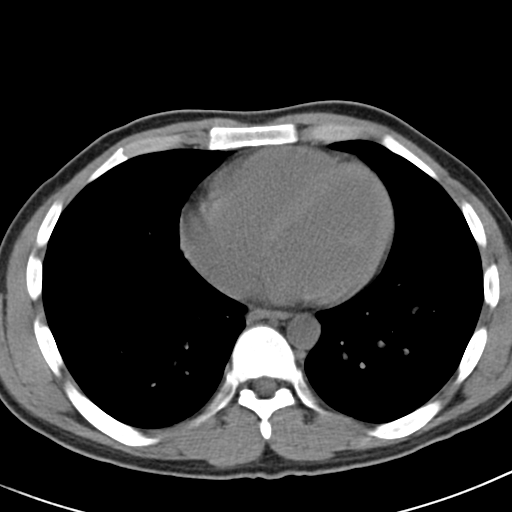

[Series 5: cor routine abd pel wo · coronal · 0.58mm/px · 3 of 106 slices shown]
[im 36/106  soft-tissue]
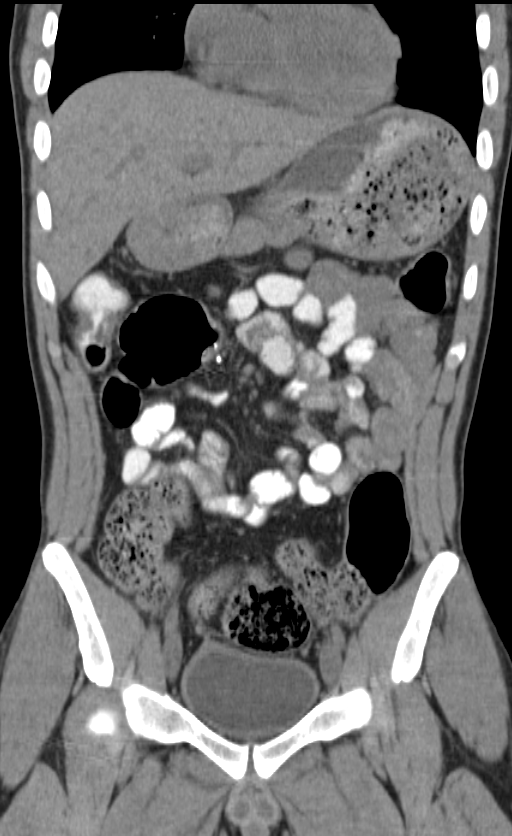
[im 47/106  soft-tissue]
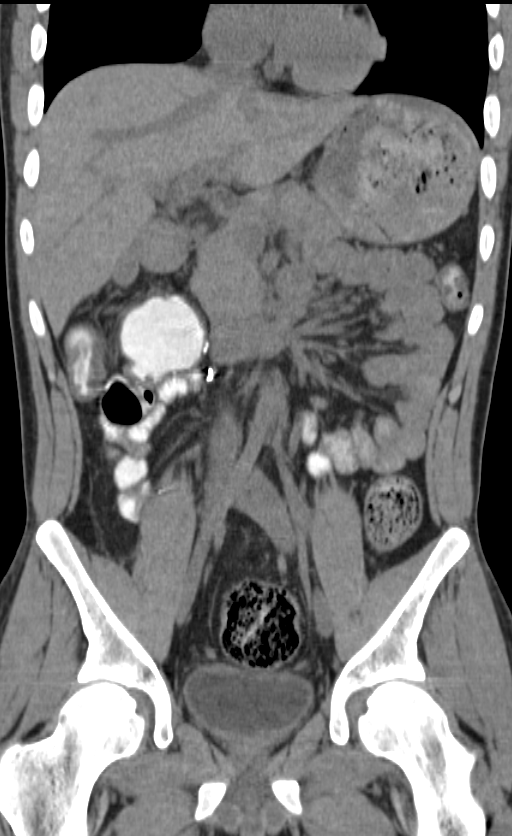
[im 59/106  soft-tissue]
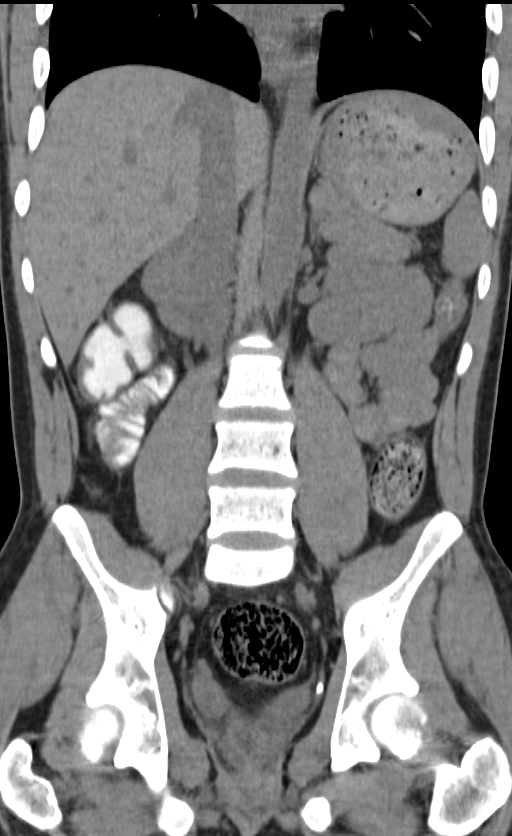

[15 of 46 positions shown; findings below may reference images not displayed]

FINDINGS: Lung bases are clear.  No pericardial fluid.

No focal hepatic lesions non contrast exam. The pancreas,
gallbladder, spleen, adrenal glands, kidneys are normal.

Stomach duodenum appear normal. The jejunum and ileum appear normal.
There is no mucosal irregularity evident. No evidence of edema or
bowel wall thickening. There is a surgical anastomosis in the right
lower quadrant with partial right colectomy. No evidence nodularity,
stricture, or obstruction at the anastomosis. There is a rounded
lymph node in the ileocecal mesenteric measuring 12 mm (image 45,
series 2). Neck evidence of inflammation or obstruction of the
colon. Moderate volume of stool in the rectum.

Abdominal or is normal caliber. No retroperitoneal periportal
lymphadenopathy.

No free fluid the pelvis. No pelvic lymphadenopathy. Prostate gland
and bladder normal.
IMPRESSION: 1. No evidence of active Crohn's disease.  No fistula or abscess.
2. Enteric colonic anastomosis in the right lower quadrant without
complication.
3. Single lymph node in the ileocecal mesentery is nonspecific.  .

## 2016-11-25 ENCOUNTER — Inpatient Hospital Stay
Admit: 2016-11-25 | Discharge: 2016-11-25 | Discharge status: Against Medical Advice | Payer: PRIVATE HEALTH INSURANCE | Attending: Emergency Medicine

## 2016-11-25 DIAGNOSIS — J029 Acute pharyngitis, unspecified: Secondary | ICD-10-CM

## 2016-11-25 LAB — URINALYSIS W/ RFLX MICROSCOPIC
Bilirubin: NEGATIVE
Blood: NEGATIVE
Glucose: NEGATIVE mg/dL
Leukocyte Esterase: NEGATIVE
Nitrites: NEGATIVE
Protein: NEGATIVE mg/dL
Specific gravity: 1.025 (ref 1.003–1.030)
Urobilinogen: 1 EU/dL (ref 0.2–1.0)
pH (UA): 6.5 (ref 5.0–7.0)

## 2016-11-25 MED ORDER — ONDANSETRON (PF) 4 MG/2 ML INJECTION
4 mg/2 mL | INTRAMUSCULAR | Status: DC
Start: 2016-11-25 — End: 2016-11-25

## 2016-11-25 MED ORDER — SODIUM CHLORIDE 0.9% BOLUS IV
0.9 % | Freq: Once | INTRAVENOUS | Status: DC
Start: 2016-11-25 — End: 2016-11-25

## 2016-11-25 MED ORDER — SODIUM CHLORIDE 0.9 % INJECTION
202 mg/2 mL | INTRAMUSCULAR | Status: DC
Start: 2016-11-25 — End: 2016-11-25

## 2016-11-25 MED ORDER — SODIUM CHLORIDE 0.9 % IJ SYRG
INTRAMUSCULAR | Status: DC | PRN
Start: 2016-11-25 — End: 2016-11-25

## 2016-11-25 NOTE — ED Notes (Signed)
Dr. Dorette GrateAlzahrani spoke with patient and discussed the plan of care.  Melissa, RN went to the bedside to establish IV access.  Patient complained again that he was not being prescribed pain medications.      I informed patient that his plan of care had been discussed by Dr. Dorette GrateAlzahrani and that labs would be drawn and his symptoms would first be treated with zofran and pepcid before pain medications were ordered.  Patient was still not pleased and stated that he was going to go to another hospital.      Patient was educated on the risks of leaving against medical advice.  He refused to sign the form stating "I'm not leaving against medical advice.  I'm going to another hospital that will treat me properly."

## 2016-11-25 NOTE — ED Notes (Addendum)
Went to bedside and explained to patient that I was there to start an IV and collect blood samples for lab testing.  I also informed him about the medications that the doctor has prescribed for him.  Patient stated that due to his chronic medical condition he is " a hard stick" and "usually has ultrasound guided IVs."  He was upset that he was not prescribed any pain medications he stated "I am not drug seeking."  I told him that I would make the doctor aware of his concerns.  Dr. Dorette GrateAlzahrani was notified and went to the bedside to further speak with patient.

## 2016-11-25 NOTE — ED Triage Notes (Signed)
PT complains of abdominal pain, nausea, vomiting and loose stools; probable Krohns flare

## 2016-11-25 NOTE — ED Provider Notes (Signed)
Samuel Thompson is a 31 y.o. male with hx of anemia, crohn's who presents to the ED with complaint of abd pain. Upon reviewing CRISP, pt has a hx C diff. Infection, appendectomy, fistula repair in 2009,a R colectomy and multiple ed visits with non specific abd pain. Pt was last seen at [facility] for same complaint on Nov 09, 2016 . They stated pt was demanding percocet narcotic medication. The last time pt had percocet was on Nov 19, 2016 for 7 days; 10 mg prescribed by Dr. Nyra Capes. Per pt, associated symptoms are vomiting, diarrhea and mucus like stool. Abd pain is described as R sided, constant, aching, non radiating, worsened with  moderate. Pt reports he is perscribed humira but is not compliant due to insurance issues x 2 months. Denies herbal supplement use. No fever/chills, urinary issues, HA, CP or sob no hematemesis, no melena,      Pt also c/o cold sx Associated symptoms are sore throat, rhinorrhea, myalgias  During evaluation pt was on cell phone texting in no distress      He mention he has follow up with his doctor on Wed , tomorrow       The history is provided by the patient.   Abdominal Pain    This is a new problem. The problem occurs constantly. The problem has not changed since onset.The pain is associated with vomiting. The pain is located in the RLQ and RUQ. The pain is moderate. Associated symptoms include diarrhea and vomiting. Pertinent negatives include no fever, no dysuria, no frequency, no hematuria, no headaches and no chest pain. His past medical history is significant for Crohn's disease. The patient's surgical history includes appendectomy and colectomy.       Past Medical History:   Diagnosis Date   ??? Gastrointestinal disorder     crohns   ??? Ill-defined condition     Anemia       Past Surgical History:   Procedure Laterality Date   ??? HX APPENDECTOMY     ??? HX COLECTOMY     ??? HX SMALL BOWEL RESECTION           History reviewed. No pertinent family history.    Social History      Socioeconomic History   ??? Marital status: SINGLE     Spouse name: Not on file   ??? Number of children: Not on file   ??? Years of education: Not on file   ??? Highest education level: Not on file   Social Needs   ??? Financial resource strain: Not on file   ??? Food insecurity - worry: Not on file   ??? Food insecurity - inability: Not on file   ??? Transportation needs - medical: Not on file   ??? Transportation needs - non-medical: Not on file   Occupational History   ??? Not on file   Tobacco Use   ??? Smoking status: Never Smoker   ??? Smokeless tobacco: Never Used   Substance and Sexual Activity   ??? Alcohol use: No   ??? Drug use: No   ??? Sexual activity: Not on file   Other Topics Concern   ??? Not on file   Social History Narrative   ??? Not on file         ALLERGIES: Amoxicillin; Iodinated contrast- oral and iv dye; and Toradol [ketorolac tromethamine]    Review of Systems   Constitutional: Negative.  Negative for chills, diaphoresis and fever.   HENT: Positive for  congestion, rhinorrhea and sore throat.    Respiratory: Positive for cough. Negative for shortness of breath.    Cardiovascular: Negative.  Negative for chest pain and palpitations.   Gastrointestinal: Positive for abdominal pain, diarrhea and vomiting.   Genitourinary: Negative.  Negative for dysuria, flank pain, frequency, hematuria and urgency.   Musculoskeletal: Negative.  Negative for joint swelling.   Skin: Negative.  Negative for rash.   Allergic/Immunologic: Negative.  Negative for immunocompromised state.   Neurological: Negative.  Negative for speech difficulty, weakness and headaches.   All other systems reviewed and are negative.      Vitals:    11/25/16 0153   BP: 132/74   Pulse: 63   Resp: 18   Temp: 98.4 ??F (36.9 ??C)   SpO2: 100%   Weight: 67.1 kg (148 lb)   Height: 6' (1.829 m)            Physical Exam   Constitutional: He is oriented to person, place, and time. He appears well-developed and well-nourished. No distress.   HENT:    Head: Normocephalic and atraumatic.   Pharyngeal erythema,   Tonsils is not enlarged    Eyes: Conjunctivae and EOM are normal. Pupils are equal, round, and reactive to light. Right eye exhibits no discharge. Left eye exhibits no discharge. No scleral icterus.   Cardiovascular: Normal rate, regular rhythm and normal heart sounds. Exam reveals no gallop and no friction rub.   No murmur heard.  Pulmonary/Chest: Effort normal and breath sounds normal. No respiratory distress. He has no wheezes. He has no rales.   Abdominal: Soft. Bowel sounds are normal. He exhibits no distension. There is no tenderness. There is no rebound and no guarding.   abd mid line scar   Musculoskeletal: Normal range of motion. He exhibits no edema or tenderness.   Neurological: He is alert and oriented to person, place, and time.   Skin: Skin is warm and dry. No rash noted. He is not diaphoretic. No erythema.   Psychiatric: He has a normal mood and affect. His behavior is normal. Judgment and thought content normal.   Nursing note and vitals reviewed.       MDM  Number of Diagnoses or Management Options  Nausea and vomiting, intractability of vomiting not specified, unspecified vomiting type:   Right lower quadrant abdominal pain:   Sorethroat:   Diagnosis management comments: 31 y.o. male with Hx of crohn's presents with abd pain and cold sx. Suspect viral illness vs viral gastroenteritis unlikely crohn's flare up. Pt is well appearing , non toxic., abd is soft non-tender,   No emergent indication for CT , I doubt flare, obstruction      Plan:   Basic labs  zofran  pepcid  IVF  Reassess   -----  1:50 AM  Documented by Katrinka BlazingMelissa S Lux, acting as a scribe for Dr. Judithann SaugerAlzahrani, Maevis Mumby, MD      PROVIDER ATTESTATION:  4:17 AM    The entirety of this note, signed by me, accurately reflects all works, treatments, procedures, and medical decision making performed by me, Judithann SaugerAbdullah Dessa Ledee, MD    Patient Progress  Patient progress: stable          Procedures    Diagnostic Studies:      Lab Data:  Labs Reviewed   URINALYSIS W/ RFLX MICROSCOPIC   METABOLIC PANEL, COMPREHENSIVE   LIPASE   CBC WITH AUTOMATED DIFF   SED RATE, AUTOMATED  ED Course:     2:39 AM  Pt is not cooperative with us doing the blood work and getting the IV line. Pt is displaying drug seeking behavior by demanding narcotics multiple times. I plan to give pepcid and zofran. Pt is well appearing, ambulating and in no distress. On exam pt abd was benign. Doubt pt is having a crohn's flare most likely mild gastroenteritis. We will not give him something else until we determine he is not having a flare due to risk the opioid in IBD. Pt states he will go to university if we do not give him opioid     Pt also stated he lost his insurance but was able to go to pcp and get prescription for oxycodone on Nov 19, 2016    Patient changes his story multiple times, providing false information and facts and displaying manipulative behavior to get the drugs.    I explained to the patient multiple times, my job is to help him, given him opioid with out indication can harm him because his hx of IBD, I also explained to him that in order for us to treat him and help him we have to given him medication through the IV for the nausea first, hydrate him but he is asking to give him opioid,  before we do any work up or IV hydration    Me and Multiple nurses  tried to confess him to get the blood work and  IV but he is refusing      2:48 AM   Patient has the capacity to make decision he understand the risks, benefits and alternative     Pt refused to sign AMA form.     I discussed the risks of leaving against medical advice with the patient. Despite my best efforts to convince the patient to stay for further workup he refused further workup. I discussed specific risks of death, permanent impairment, loss of sexual function, worsened condition, and need for  prolonged hospitalization. The patient understands the risks and has capacity to make his own medical decisions. I informed him that he can return to the ER at any time and, in fact, should return if his symptoms worsen; I also informed him to see his primary care doctor and his GI doctor as soon as possible for further evaluation.
